# Patient Record
Sex: Female | Born: 1950
Health system: Southern US, Community
[De-identification: ages and names within clinical notes are randomized; demographics above are authoritative.]

## PROBLEM LIST (undated history)

## (undated) DIAGNOSIS — E079 Disorder of thyroid, unspecified: Secondary | ICD-10-CM

## (undated) DIAGNOSIS — R112 Nausea with vomiting, unspecified: Secondary | ICD-10-CM

## (undated) DIAGNOSIS — Z9889 Other specified postprocedural states: Secondary | ICD-10-CM

## (undated) DIAGNOSIS — E119 Type 2 diabetes mellitus without complications: Secondary | ICD-10-CM

## (undated) DIAGNOSIS — M1990 Unspecified osteoarthritis, unspecified site: Secondary | ICD-10-CM

## (undated) DIAGNOSIS — G629 Polyneuropathy, unspecified: Secondary | ICD-10-CM

## (undated) DIAGNOSIS — K5790 Diverticulosis of intestine, part unspecified, without perforation or abscess without bleeding: Secondary | ICD-10-CM

## (undated) HISTORY — DX: Unspecified osteoarthritis, unspecified site: M19.90

## (undated) HISTORY — DX: Polyneuropathy, unspecified: G62.9

## (undated) HISTORY — PX: ABDOMINAL HYSTERECTOMY: SHX81

## (undated) HISTORY — PX: HEMORRHOID SURGERY: SHX153

## (undated) HISTORY — PX: CHOLECYSTECTOMY: SHX55

---

## 2016-09-22 ENCOUNTER — Other Ambulatory Visit: Payer: Self-pay | Admitting: Endocrinology

## 2016-09-22 DIAGNOSIS — E049 Nontoxic goiter, unspecified: Secondary | ICD-10-CM

## 2016-09-26 ENCOUNTER — Encounter: Payer: BLUE CROSS/BLUE SHIELD | Attending: Endocrinology | Admitting: Registered"

## 2016-09-26 DIAGNOSIS — Z713 Dietary counseling and surveillance: Secondary | ICD-10-CM | POA: Insufficient documentation

## 2016-09-26 DIAGNOSIS — E119 Type 2 diabetes mellitus without complications: Secondary | ICD-10-CM | POA: Diagnosis not present

## 2016-09-26 NOTE — Patient Instructions (Addendum)
Plan:  Aim for 2-3 Carb Choices per meal (30-45 grams)  Aim for 0-1 Carbs per snack if hungry  Include protein in moderation with your meals and snacks Consider having oatmeal with walnuts for breakfast option Aim to have protein with breakfast Consider reading food labels for Total Carbohydrate  Consider  increasing your activity level 30 minutes daily as tolerated Continue checking BG at alternate times per day as directed by MD  Constinue taking medication as directed by MD BRAT diet to control diarrhea - bananas, rice, apples, toast (be sure to count carbs)

## 2016-09-26 NOTE — Progress Notes (Signed)
Diabetes Self-Management Education  Visit Type: First/Initial  Appt. Start Time: 1400 Appt. End Time: 1500  09/26/2016  Pamela Murray, identified by name and date of birth, is a 66 y.o. female with a diagnosis of Diabetes: Type 2.   ASSESSMENT Patient states she has a hard time with a lot of foods due to diverticulitis which causes diarrhea and reflux with Aciphex is the only reflux med she can take. Avoids spicy foods. She reports she thinks she has been eating too many potatoes, bread and pasta but is motivated to do better. Patient reports that she is expecting her first grandbaby in October and wants to feel well enough to spend time taking care of the new baby. She is very motivated to take care of her diabetes and will work on diet and exercise and return in 3 months for a follow-up after next A1C test.  Height 5' 5.5" (1.664 m), weight 194 lb 9.6 oz (88.3 kg). Body mass index is 31.89 kg/m.      Diabetes Self-Management Education - 09/26/16 1413      Visit Information   Visit Type First/Initial     Initial Visit   Diabetes Type Type 2   Are you currently following a meal plan? Yes   What type of meal plan do you follow? limit carbohydrates   Are you taking your medications as prescribed? Yes   Date Diagnosed 2002     Health Coping   How would you rate your overall health? Fair     Psychosocial Assessment   Patient Belief/Attitude about Diabetes Motivated to manage diabetes   How often do you need to have someone help you when you read instructions, pamphlets, or other written materials from your doctor or pharmacy? 1 - Never   What is the last grade level you completed in school? 12     Complications   Last HgB A1C per patient/outside source 9.9 %   How often do you check your blood sugar? 1-2 times/day   Fasting Blood glucose range (mg/dL) 130-179  127-209, believes the high was from late dinner   Number of hypoglycemic episodes per month 0   Number of  hyperglycemic episodes per week 2   Can you tell when your blood sugar is high? Yes  feels dizzy and spacy   What do you do if your blood sugar is high? drinks water and moves   Have you had a dilated eye exam in the past 12 months? Yes   Have you had a dental exam in the past 12 months? Yes   Are you checking your feet? Yes   How many days per week are you checking your feet? 1     Dietary Intake   Breakfast special k with red berries, lactose-free milk, coffee OR grits OR toast with margine and jelly   Snack (morning) 2-3 ginger snaps    Lunch grits OR grilled chicken sandwich, chips, water, fruit punch   Snack (afternoon) sugar free chocolate pudding   Dinner seafood OR salad with grilled chiken OR spaghetti   Beverage(s) diet dr pepper     Exercise   Exercise Type ADL's   How many days per week to you exercise? 0   How many minutes per day do you exercise? 0   Total minutes per week of exercise 0     Patient Education   Previous Diabetes Education Yes (please comment)  2002   Nutrition management  Role of diet in the  treatment of diabetes and the relationship between the three main macronutrients and blood glucose level;Food label reading, portion sizes and measuring food.;Carbohydrate counting   Physical activity and exercise  Role of exercise on diabetes management, blood pressure control and cardiac health.   Monitoring Identified appropriate SMBG and/or A1C goals.     Individualized Goals (developed by patient)   Nutrition General guidelines for healthy choices and portions discussed   Physical Activity Exercise 5-7 days per week     Outcomes   Expected Outcomes Demonstrated interest in learning. Expect positive outcomes   Future DMSE PRN   Program Status Completed      Individualized Plan for Diabetes Self-Management Training:   Learning Objective:  Patient will have a greater understanding of diabetes self-management. Patient education plan is to attend  individual and/or group sessions per assessed needs and concerns.  Patient Instructions  Plan:  Aim for 2-3 Carb Choices per meal (30-45 grams)  Aim for 0-1 Carbs per snack if hungry  Include protein in moderation with your meals and snacks Consider having oatmeal with walnuts for breakfast option Aim to have protein with breakfast Consider reading food labels for Total Carbohydrate  Consider  increasing your activity level 30 minutes daily as tolerated Continue checking BG at alternate times per day as directed by MD  Constinue taking medication as directed by MD BRAT diet to control diarrhea - bananas, rice, apples, toast (be sure to count carbs)    Expected Outcomes:  Demonstrated interest in learning. Expect positive outcomes  Education material provided:   Carb Counting and Food Label handouts  Meal Plan Card  A1c handout  Portion Plate  Snack Suggestions  Types of Fat  If problems or questions, patient to contact team via:  Phone and Email  Future DSME appointment: PRN

## 2016-12-14 ENCOUNTER — Ambulatory Visit
Admission: RE | Admit: 2016-12-14 | Discharge: 2016-12-14 | Disposition: A | Discharge status: Home / Self Care | Payer: BLUE CROSS/BLUE SHIELD | Source: Ambulatory Visit | Attending: Endocrinology | Admitting: Endocrinology

## 2016-12-14 DIAGNOSIS — E049 Nontoxic goiter, unspecified: Secondary | ICD-10-CM

## 2016-12-26 ENCOUNTER — Other Ambulatory Visit: Payer: Self-pay | Admitting: Endocrinology

## 2016-12-26 ENCOUNTER — Ambulatory Visit: Payer: BLUE CROSS/BLUE SHIELD | Admitting: Registered"

## 2016-12-26 DIAGNOSIS — E049 Nontoxic goiter, unspecified: Secondary | ICD-10-CM

## 2016-12-28 ENCOUNTER — Ambulatory Visit
Admission: RE | Admit: 2016-12-28 | Discharge: 2016-12-28 | Disposition: A | Discharge status: Home / Self Care | Payer: BLUE CROSS/BLUE SHIELD | Source: Ambulatory Visit | Attending: Endocrinology | Admitting: Endocrinology

## 2016-12-28 ENCOUNTER — Other Ambulatory Visit (HOSPITAL_COMMUNITY)
Admission: RE | Admit: 2016-12-28 | Discharge: 2016-12-28 | Disposition: A | Discharge status: Home / Self Care | Payer: BLUE CROSS/BLUE SHIELD | Source: Ambulatory Visit | Attending: Radiology | Admitting: Radiology

## 2016-12-28 DIAGNOSIS — E041 Nontoxic single thyroid nodule: Secondary | ICD-10-CM | POA: Diagnosis not present

## 2016-12-28 DIAGNOSIS — E049 Nontoxic goiter, unspecified: Secondary | ICD-10-CM

## 2017-01-12 DIAGNOSIS — E538 Deficiency of other specified B group vitamins: Secondary | ICD-10-CM | POA: Diagnosis not present

## 2017-01-16 ENCOUNTER — Encounter (HOSPITAL_COMMUNITY): Payer: Self-pay

## 2017-02-23 DIAGNOSIS — I10 Essential (primary) hypertension: Secondary | ICD-10-CM | POA: Diagnosis not present

## 2017-02-23 DIAGNOSIS — F411 Generalized anxiety disorder: Secondary | ICD-10-CM | POA: Diagnosis not present

## 2017-02-23 DIAGNOSIS — Z79899 Other long term (current) drug therapy: Secondary | ICD-10-CM | POA: Diagnosis not present

## 2017-02-23 DIAGNOSIS — G894 Chronic pain syndrome: Secondary | ICD-10-CM | POA: Diagnosis not present

## 2017-02-23 DIAGNOSIS — M5030 Other cervical disc degeneration, unspecified cervical region: Secondary | ICD-10-CM | POA: Diagnosis not present

## 2017-02-23 DIAGNOSIS — Z23 Encounter for immunization: Secondary | ICD-10-CM | POA: Diagnosis not present

## 2017-02-23 DIAGNOSIS — E119 Type 2 diabetes mellitus without complications: Secondary | ICD-10-CM | POA: Diagnosis not present

## 2017-02-23 DIAGNOSIS — Z0000 Encounter for general adult medical examination without abnormal findings: Secondary | ICD-10-CM | POA: Diagnosis not present

## 2017-02-27 DIAGNOSIS — E538 Deficiency of other specified B group vitamins: Secondary | ICD-10-CM | POA: Diagnosis not present

## 2017-03-30 DIAGNOSIS — E538 Deficiency of other specified B group vitamins: Secondary | ICD-10-CM | POA: Diagnosis not present

## 2017-03-30 DIAGNOSIS — Z23 Encounter for immunization: Secondary | ICD-10-CM | POA: Diagnosis not present

## 2017-04-30 DIAGNOSIS — E538 Deficiency of other specified B group vitamins: Secondary | ICD-10-CM | POA: Diagnosis not present

## 2017-05-04 DIAGNOSIS — E049 Nontoxic goiter, unspecified: Secondary | ICD-10-CM | POA: Diagnosis not present

## 2017-05-04 DIAGNOSIS — E1165 Type 2 diabetes mellitus with hyperglycemia: Secondary | ICD-10-CM | POA: Diagnosis not present

## 2017-05-04 DIAGNOSIS — E039 Hypothyroidism, unspecified: Secondary | ICD-10-CM | POA: Diagnosis not present

## 2017-05-04 DIAGNOSIS — E7800 Pure hypercholesterolemia, unspecified: Secondary | ICD-10-CM | POA: Diagnosis not present

## 2017-05-04 DIAGNOSIS — I10 Essential (primary) hypertension: Secondary | ICD-10-CM | POA: Diagnosis not present

## 2017-05-04 DIAGNOSIS — E538 Deficiency of other specified B group vitamins: Secondary | ICD-10-CM | POA: Diagnosis not present

## 2017-05-21 DIAGNOSIS — J069 Acute upper respiratory infection, unspecified: Secondary | ICD-10-CM | POA: Diagnosis not present

## 2017-05-21 DIAGNOSIS — J209 Acute bronchitis, unspecified: Secondary | ICD-10-CM | POA: Diagnosis not present

## 2017-05-30 ENCOUNTER — Emergency Department (HOSPITAL_COMMUNITY)
Admission: EM | Admit: 2017-05-30 | Discharge: 2017-05-30 | Disposition: A | Discharge status: Home / Self Care | Payer: Medicare HMO | Attending: Emergency Medicine | Admitting: Emergency Medicine

## 2017-05-30 ENCOUNTER — Other Ambulatory Visit: Payer: Self-pay

## 2017-05-30 ENCOUNTER — Emergency Department (HOSPITAL_COMMUNITY): Payer: Medicare HMO

## 2017-05-30 ENCOUNTER — Encounter (HOSPITAL_COMMUNITY): Payer: Self-pay

## 2017-05-30 DIAGNOSIS — E119 Type 2 diabetes mellitus without complications: Secondary | ICD-10-CM | POA: Diagnosis not present

## 2017-05-30 DIAGNOSIS — R000 Tachycardia, unspecified: Secondary | ICD-10-CM | POA: Diagnosis not present

## 2017-05-30 DIAGNOSIS — R110 Nausea: Secondary | ICD-10-CM | POA: Insufficient documentation

## 2017-05-30 DIAGNOSIS — R1084 Generalized abdominal pain: Secondary | ICD-10-CM

## 2017-05-30 DIAGNOSIS — R109 Unspecified abdominal pain: Secondary | ICD-10-CM | POA: Diagnosis not present

## 2017-05-30 DIAGNOSIS — R079 Chest pain, unspecified: Secondary | ICD-10-CM | POA: Diagnosis not present

## 2017-05-30 DIAGNOSIS — R1030 Lower abdominal pain, unspecified: Secondary | ICD-10-CM | POA: Diagnosis not present

## 2017-05-30 DIAGNOSIS — R1031 Right lower quadrant pain: Secondary | ICD-10-CM | POA: Diagnosis present

## 2017-05-30 HISTORY — DX: Disorder of thyroid, unspecified: E07.9

## 2017-05-30 HISTORY — DX: Type 2 diabetes mellitus without complications: E11.9

## 2017-05-30 LAB — CBC
HEMATOCRIT: 42.6 % (ref 36.0–46.0)
Hemoglobin: 14.5 g/dL (ref 12.0–15.0)
MCH: 30.3 pg (ref 26.0–34.0)
MCHC: 34 g/dL (ref 30.0–36.0)
MCV: 89.1 fL (ref 78.0–100.0)
PLATELETS: 187 10*3/uL (ref 150–400)
RBC: 4.78 MIL/uL (ref 3.87–5.11)
RDW: 13.3 % (ref 11.5–15.5)
WBC: 6 10*3/uL (ref 4.0–10.5)

## 2017-05-30 LAB — URINALYSIS, ROUTINE W REFLEX MICROSCOPIC
BILIRUBIN URINE: NEGATIVE
Ketones, ur: 5 mg/dL — AB
LEUKOCYTES UA: NEGATIVE
NITRITE: NEGATIVE
Protein, ur: 100 mg/dL — AB
SPECIFIC GRAVITY, URINE: 1.032 — AB (ref 1.005–1.030)
pH: 5 (ref 5.0–8.0)

## 2017-05-30 LAB — LIPASE, BLOOD: LIPASE: 22 U/L (ref 11–51)

## 2017-05-30 LAB — COMPREHENSIVE METABOLIC PANEL
ALT: 45 U/L (ref 14–54)
AST: 39 U/L (ref 15–41)
Albumin: 4.1 g/dL (ref 3.5–5.0)
Alkaline Phosphatase: 62 U/L (ref 38–126)
Anion gap: 10 (ref 5–15)
BUN: 16 mg/dL (ref 6–20)
CHLORIDE: 95 mmol/L — AB (ref 101–111)
CO2: 27 mmol/L (ref 22–32)
CREATININE: 1.2 mg/dL — AB (ref 0.44–1.00)
Calcium: 9.8 mg/dL (ref 8.9–10.3)
GFR, EST AFRICAN AMERICAN: 54 mL/min — AB (ref >60)
GFR, EST NON AFRICAN AMERICAN: 46 mL/min — AB (ref >60)
Glucose, Bld: 300 mg/dL — ABNORMAL HIGH (ref 65–99)
POTASSIUM: 4.3 mmol/L (ref 3.5–5.1)
Sodium: 132 mmol/L — ABNORMAL LOW (ref 135–145)
TOTAL PROTEIN: 6.8 g/dL (ref 6.5–8.1)
Total Bilirubin: 0.5 mg/dL (ref 0.3–1.2)

## 2017-05-30 LAB — I-STAT TROPONIN, ED: Troponin i, poc: 0.01 ng/mL (ref 0.00–0.08)

## 2017-05-30 MED ORDER — ONDANSETRON HCL 4 MG/2ML IJ SOLN
4.0000 mg | Freq: Once | INTRAMUSCULAR | Status: AC
  Administered 2017-05-30: 4 mg via INTRAVENOUS
  Filled 2017-05-30: qty 2

## 2017-05-30 MED ORDER — ONDANSETRON 4 MG PO TBDP: 4.0000 mg | ORAL_TABLET | Freq: Three times a day (TID) | ORAL | 0 refills | Status: DC | PRN

## 2017-05-30 MED ORDER — IOPAMIDOL (ISOVUE-300) INJECTION 61%
INTRAVENOUS | Status: AC
  Administered 2017-05-30: 100 mL
  Filled 2017-05-30: qty 100

## 2017-05-30 MED ORDER — SODIUM CHLORIDE 0.9 % IV BOLUS (SEPSIS)
1000.0000 mL | Freq: Once | INTRAVENOUS | Status: AC
  Administered 2017-05-30: 1000 mL via INTRAVENOUS

## 2017-05-30 MED ORDER — GI COCKTAIL ~~LOC~~
30.0000 mL | Freq: Once | ORAL | Status: AC
  Administered 2017-05-30: 30 mL via ORAL
  Filled 2017-05-30: qty 30

## 2017-05-30 MED ORDER — SODIUM CHLORIDE 0.9 % IV BOLUS (SEPSIS)
500.0000 mL | Freq: Once | INTRAVENOUS | Status: AC
  Administered 2017-05-30: 500 mL via INTRAVENOUS

## 2017-05-30 MED ORDER — DICYCLOMINE HCL 10 MG PO CAPS
10.0000 mg | ORAL_CAPSULE | Freq: Once | ORAL | Status: AC
  Administered 2017-05-30: 10 mg via ORAL
  Filled 2017-05-30: qty 1

## 2017-05-30 MED ORDER — SUCRALFATE 1 GM/10ML PO SUSP: 1.0000 g | Freq: Three times a day (TID) | ORAL | 0 refills | Status: DC

## 2017-05-30 MED ORDER — MORPHINE SULFATE (PF) 4 MG/ML IV SOLN
4.0000 mg | Freq: Once | INTRAVENOUS | Status: AC
  Administered 2017-05-30: 4 mg via INTRAVENOUS
  Filled 2017-05-30: qty 1

## 2017-05-30 NOTE — ED Notes (Signed)
Patient transported to X-ray 

## 2017-05-30 NOTE — ED Provider Notes (Signed)
Emergency Department Provider Note   I have reviewed the triage vital signs and the nursing notes.   HISTORY  Chief Complaint Abdominal Pain   HPI Pamela Murray is a 66 y.o. female with PMH of DM, thyroid disease, and chronic abdominal pain is to the emergency department for evaluation of worsening abdominal pain, distention, and elevated heart rate.  The patient states that she has had abdominal pain for the last 30 years but it has been worse over the last year.  States she frequently feels lightheaded and continues to have this today.  Went to her primary care physician's office today was found to have an elevated heart rate with worsening abdominal pain.  She was then sent to the emergency department for further evaluation including CT imaging of the abdomen.  Denies any dysuria, she denies vaginal bleeding or discharge.  No worsening vomiting or diarrhea.  No blood in the stool.  No recent antibiotics.  She denies any chest pain, palpitations, difficulty breathing. No radiation in symptoms. She has tried Hydrocodone at home with little to no relief in symptoms.   Past Medical History:  Diagnosis Date  . Diabetes mellitus without complication ([place])   . Thyroid disease     There are no active problems to display for this patient.   Current Outpatient Rx  . Order #: [id number] Class: Historical Med  . Order #: [id number] Class: Historical Med  . Order #: [id number] Class: Historical Med  . Order #: [id number] Class: Historical Med  . Order #: [id number] Class: Historical Med  . Order #: [id number] Class: Historical Med  . Order #: [id number] Class: Historical Med  . Order #: [id number] Class: Historical Med  . Order #: [id number] Class: Historical Med  . Order #: [id number] Class: Historical Med  . Order #: [id number] Class: Historical Med  . Order #: [id number] Class: Historical Med  . Order #: [id number] Class: Historical Med  . Order #: [id number] Class: Historical Med  . Order #:  [id number] Class: Historical Med  . Order #: [id number] Class: Historical Med  . Order #: [id number] Class: Print  . Order #: [id number] Class: Print    Allergies Statins  No family history on file.  Social History Social History   Tobacco Use  . Smoking status: Never Smoker  . Smokeless tobacco: Never Used  Substance Use Topics  . Alcohol use: No    Frequency: Never  . Drug use: Not on file    Review of Systems  Constitutional: No fever/chills. Positive fatigue.  Eyes: No visual changes. ENT: No sore throat. Cardiovascular: Denies chest pain. Respiratory: Denies shortness of breath. Gastrointestinal: Positive abdominal pain. Positive nausea, no vomiting. Positive diarrhea.  No constipation. Genitourinary: Negative for dysuria. Musculoskeletal: Negative for back pain. Skin: Negative for rash. Neurological: Negative for headaches, focal weakness or numbness.  10-point ROS otherwise negative.  ____________________________________________   PHYSICAL EXAM:  VITAL SIGNS: ED Triage Vitals  Enc Vitals Group     BP 05/30/17 1724 132/86     Pulse Rate 05/30/17 1724 (!) 145     Resp 05/30/17 1724 18     Temp 05/30/17 1724 100.2 F (37.9 C)     Temp Source 05/30/17 1724 Oral     SpO2 05/30/17 1724 97 %     Pain Score 05/30/17 1723 9   Constitutional: Alert and oriented. Well appearing and in no acute distress. Eyes: Conjunctivae are normal.  Head: Atraumatic. Nose: No congestion/rhinnorhea. Mouth/Throat: Mucous membranes are moist.  Neck: No stridor.  Cardiovascular: Sinus tachycardia. Good peripheral circulation. Grossly normal heart sounds.  Respiratory: Normal respiratory effort.  No retractions. Lungs CTAB. Gastrointestinal: Soft with mild distention and focal RLQ tenderness to palpation.  Musculoskeletal: No lower extremity tenderness nor edema. No gross deformities of extremities. Neurologic:  Normal speech and language. No gross focal neurologic deficits are  appreciated.  Skin:  Skin is warm, dry and intact. No rash noted.   ____________________________________________   LABS (all labs ordered are listed, but only abnormal results are displayed)  Labs Reviewed  COMPREHENSIVE METABOLIC PANEL - Abnormal; Notable for the following components:      Result Value   Sodium 132 (*)    Chloride 95 (*)    Glucose, Bld 300 (*)    Creatinine, Ser 1.20 (*)    GFR calc non Af Amer 46 (*)    GFR calc Af Amer 54 (*)    All other components within normal limits  URINALYSIS, ROUTINE W REFLEX MICROSCOPIC - Abnormal; Notable for the following components:   Color, Urine AMBER (*)    APPearance HAZY (*)    Specific Gravity, Urine 1.032 (*)    Glucose, UA >=500 (*)    Hgb urine dipstick SMALL (*)    Ketones, ur 5 (*)    Protein, ur 100 (*)    Bacteria, UA RARE (*)    Squamous Epithelial / LPF 0-5 (*)    All other components within normal limits  LIPASE, BLOOD  CBC  I-STAT TROPONIN, ED   ____________________________________________  EKG   EKG Interpretation  Date/Time:  Wednesday May 30 2017 17:24:46 EST Ventricular Rate:  135 PR Interval:  122 QRS Duration: 78 QT Interval:  282 QTC Calculation: 423 R Axis:   101 Text Interpretation:  Sinus tachycardia Right axis Abnormal ECG No STEMI.  Confirmed by Nanda Quinton [phone]) on 05/30/2017 5:46:45 PM       ____________________________________________  RADIOLOGY  Dg Chest 2 View  Result Date: 05/30/2017 CLINICAL DATA:  Tachycardia.  Chest pain. EXAM: CHEST  2 VIEW COMPARISON:  None. FINDINGS: Low volume chest. There is no edema, consolidation, effusion, or pneumothorax. Normal heart size and mediastinal contours. Cholecystectomy clips. IMPRESSION: Low volume chest without acute superimposed finding. Electronically Signed   By: Monte Fantasia M.D.   On: 05/30/2017 18:46   Ct Abdomen Pelvis W Contrast  Result Date: 05/30/2017 CLINICAL DATA:  Abdominal pain with diverticulitis  suspected. EXAM: CT ABDOMEN AND PELVIS WITH CONTRAST TECHNIQUE: Multidetector CT imaging of the abdomen and pelvis was performed using the standard protocol following bolus administration of intravenous contrast. CONTRAST:  <See Chart> ISOVUE-300 IOPAMIDOL (ISOVUE-300) INJECTION 61% COMPARISON:  None. FINDINGS: Lower chest:  No acute finding. Hepatobiliary: Borderline for hepatic steatosis. Negative for liver lesion.Cholecystectomy. Normal common bile duct diameter. Pancreas: Generalized atrophy.  No inflammation or duct dilatation. Spleen: Unremarkable. Adrenals/Urinary Tract: Negative adrenals. No hydronephrosis or stone. Bilateral renal cortical low densities are too small for accurate densitometry. Unremarkable bladder. Stomach/Bowel: No obstruction. No appendicitis. Scattered colonic diverticula. No diverticulitis. Vascular/Lymphatic: Mild atherosclerotic calcification. No mass or adenopathy. Reproductive:Hysterectomy.  Negative adnexae. Other: No ascites or pneumoperitoneum. Musculoskeletal: Lumbar disc and facet degeneration. No acute osseous finding. IMPRESSION: 1. No acute finding or explanation for abdominal pain. 2. Cholecystectomy and hysterectomy. Electronically Signed   By: Monte Fantasia M.D.   On: 05/30/2017 20:20    ____________________________________________   PROCEDURES  Procedure(s) performed:   Procedures  None ____________________________________________   INITIAL IMPRESSION / ASSESSMENT AND PLAN / ED COURSE  Pertinent labs & imaging results that were available during my care  of the patient were reviewed by me and considered in my medical decision making (see chart for details).  Patient presents to the emergency department for evaluation of cardiac, borderline fever.  Patient with history of chronic abdominal pain but states it is worse recently.  She has focal right lower quadrant tenderness to palpation but no rebound or guarding.  EKG shows sinus rhythm.  Labs thus  far are unremarkable but plan for CT of the abdomen and pelvis to rule out acute appendicitis/diverticulitis/colitis. No clinical concern for SBO but partial obstruction is a possibility.   08:33 PM Patient with normal CT abdomen/pelvis. HR down-trending. Will give additional IVF, Bentyl, and Gi cocktail.   09:10 PM Patient tolerating PO. HR down-trending and back to normal. Plan for discharge with plan to increase PO fluids and add Carafate and Zofran.   At this time, I do not feel there is any life-threatening condition present. I have reviewed and discussed all results (EKG, imaging, lab, urine as appropriate), exam findings with patient. I have reviewed nursing notes and appropriate previous records.  I feel the patient is safe to be discharged home without further emergent workup. Discussed usual and customary return precautions. Patient and family (if present) verbalize understanding and are comfortable with this plan.  Patient will follow-up with their primary care provider. If they do not have a primary care provider, information for follow-up has been provided to them. All questions have been answered.  ____________________________________________  FINAL CLINICAL IMPRESSION(S) / ED DIAGNOSES  Final diagnoses:  Generalized abdominal pain  Tachycardia  Nausea     MEDICATIONS GIVEN DURING THIS VISIT:  Medications  sodium chloride 0.9 % bolus 1,000 mL (0 mLs Intravenous Stopped 05/30/17 2009)  morphine 4 MG/ML injection 4 mg (4 mg Intravenous Given 05/30/17 1908)  ondansetron (ZOFRAN) injection 4 mg (4 mg Intravenous Given 05/30/17 1909)  iopamidol (ISOVUE-300) 61 % injection (100 mLs  Contrast Given 05/30/17 1937)  dicyclomine (BENTYL) capsule 10 mg (10 mg Oral Given 05/30/17 2036)  gi cocktail (Maalox,Lidocaine,Donnatal) (30 mLs Oral Given 05/30/17 2036)  sodium chloride 0.9 % bolus 500 mL (500 mLs Intravenous New Bag/Given 05/30/17 2038)     NEW OUTPATIENT MEDICATIONS  STARTED DURING THIS VISIT:  Carafate and Zofran   Note:  This document was prepared using Dragon voice recognition software and may include unintentional dictation errors.  Nanda Quinton, MD Emergency Medicine    Dorianne Perret, [facility], MD 05/30/17 2114

## 2017-05-30 NOTE — Discharge Instructions (Signed)

## 2017-05-30 NOTE — ED Triage Notes (Addendum)
Pt states she has had abdominal pain X2 weeks. She was sent here by her PCP for tachycardia and abdominal pain and PCP is concerned for sepsis. Pt also reports some chest discomfort/fluttering and dizziness.

## 2017-06-13 DIAGNOSIS — I10 Essential (primary) hypertension: Secondary | ICD-10-CM | POA: Diagnosis not present

## 2017-06-13 DIAGNOSIS — Z86010 Personal history of colonic polyps: Secondary | ICD-10-CM | POA: Diagnosis not present

## 2017-06-13 DIAGNOSIS — K2270 Barrett's esophagus without dysplasia: Secondary | ICD-10-CM | POA: Diagnosis not present

## 2017-06-13 DIAGNOSIS — K219 Gastro-esophageal reflux disease without esophagitis: Secondary | ICD-10-CM | POA: Diagnosis not present

## 2017-06-13 DIAGNOSIS — K589 Irritable bowel syndrome without diarrhea: Secondary | ICD-10-CM | POA: Diagnosis not present

## 2017-06-28 DIAGNOSIS — D485 Neoplasm of uncertain behavior of skin: Secondary | ICD-10-CM | POA: Diagnosis not present

## 2017-06-28 DIAGNOSIS — L710 Perioral dermatitis: Secondary | ICD-10-CM | POA: Diagnosis not present

## 2017-06-28 DIAGNOSIS — D225 Melanocytic nevi of trunk: Secondary | ICD-10-CM | POA: Diagnosis not present

## 2017-06-29 DIAGNOSIS — E538 Deficiency of other specified B group vitamins: Secondary | ICD-10-CM | POA: Diagnosis not present

## 2017-07-19 DIAGNOSIS — E119 Type 2 diabetes mellitus without complications: Secondary | ICD-10-CM | POA: Diagnosis not present

## 2017-07-19 DIAGNOSIS — Z794 Long term (current) use of insulin: Secondary | ICD-10-CM | POA: Diagnosis not present

## 2017-07-19 DIAGNOSIS — H527 Unspecified disorder of refraction: Secondary | ICD-10-CM | POA: Diagnosis not present

## 2017-07-25 DIAGNOSIS — D124 Benign neoplasm of descending colon: Secondary | ICD-10-CM | POA: Diagnosis not present

## 2017-07-25 DIAGNOSIS — K5730 Diverticulosis of large intestine without perforation or abscess without bleeding: Secondary | ICD-10-CM | POA: Diagnosis not present

## 2017-07-25 DIAGNOSIS — Z86010 Personal history of colonic polyps: Secondary | ICD-10-CM | POA: Diagnosis not present

## 2017-07-27 DIAGNOSIS — E538 Deficiency of other specified B group vitamins: Secondary | ICD-10-CM | POA: Diagnosis not present

## 2017-08-07 DIAGNOSIS — L988 Other specified disorders of the skin and subcutaneous tissue: Secondary | ICD-10-CM | POA: Diagnosis not present

## 2017-08-07 DIAGNOSIS — D485 Neoplasm of uncertain behavior of skin: Secondary | ICD-10-CM | POA: Diagnosis not present

## 2017-08-13 DIAGNOSIS — Z1231 Encounter for screening mammogram for malignant neoplasm of breast: Secondary | ICD-10-CM | POA: Diagnosis not present

## 2017-08-13 DIAGNOSIS — Z01419 Encounter for gynecological examination (general) (routine) without abnormal findings: Secondary | ICD-10-CM | POA: Diagnosis not present

## 2017-08-20 DIAGNOSIS — E7800 Pure hypercholesterolemia, unspecified: Secondary | ICD-10-CM | POA: Diagnosis not present

## 2017-08-20 DIAGNOSIS — E538 Deficiency of other specified B group vitamins: Secondary | ICD-10-CM | POA: Diagnosis not present

## 2017-08-20 DIAGNOSIS — E1165 Type 2 diabetes mellitus with hyperglycemia: Secondary | ICD-10-CM | POA: Diagnosis not present

## 2017-08-20 DIAGNOSIS — I10 Essential (primary) hypertension: Secondary | ICD-10-CM | POA: Diagnosis not present

## 2017-08-20 DIAGNOSIS — E039 Hypothyroidism, unspecified: Secondary | ICD-10-CM | POA: Diagnosis not present

## 2017-08-20 DIAGNOSIS — E049 Nontoxic goiter, unspecified: Secondary | ICD-10-CM | POA: Diagnosis not present

## 2017-08-20 DIAGNOSIS — R21 Rash and other nonspecific skin eruption: Secondary | ICD-10-CM | POA: Diagnosis not present

## 2017-08-28 DIAGNOSIS — E538 Deficiency of other specified B group vitamins: Secondary | ICD-10-CM | POA: Diagnosis not present

## 2017-08-28 DIAGNOSIS — Z79899 Other long term (current) drug therapy: Secondary | ICD-10-CM | POA: Diagnosis not present

## 2017-08-28 DIAGNOSIS — R21 Rash and other nonspecific skin eruption: Secondary | ICD-10-CM | POA: Diagnosis not present

## 2017-08-28 DIAGNOSIS — E119 Type 2 diabetes mellitus without complications: Secondary | ICD-10-CM | POA: Diagnosis not present

## 2017-08-28 DIAGNOSIS — M5136 Other intervertebral disc degeneration, lumbar region: Secondary | ICD-10-CM | POA: Diagnosis not present

## 2017-08-28 DIAGNOSIS — G894 Chronic pain syndrome: Secondary | ICD-10-CM | POA: Diagnosis not present

## 2017-08-28 DIAGNOSIS — I10 Essential (primary) hypertension: Secondary | ICD-10-CM | POA: Diagnosis not present

## 2017-08-28 DIAGNOSIS — M5030 Other cervical disc degeneration, unspecified cervical region: Secondary | ICD-10-CM | POA: Diagnosis not present

## 2017-08-30 DIAGNOSIS — B86 Scabies: Secondary | ICD-10-CM | POA: Diagnosis not present

## 2017-09-17 DIAGNOSIS — E1165 Type 2 diabetes mellitus with hyperglycemia: Secondary | ICD-10-CM | POA: Diagnosis not present

## 2017-09-25 DIAGNOSIS — E538 Deficiency of other specified B group vitamins: Secondary | ICD-10-CM | POA: Diagnosis not present

## 2017-10-12 DIAGNOSIS — L738 Other specified follicular disorders: Secondary | ICD-10-CM | POA: Diagnosis not present

## 2017-10-12 DIAGNOSIS — K130 Diseases of lips: Secondary | ICD-10-CM | POA: Diagnosis not present

## 2017-10-24 DIAGNOSIS — E538 Deficiency of other specified B group vitamins: Secondary | ICD-10-CM | POA: Diagnosis not present

## 2017-11-23 DIAGNOSIS — E538 Deficiency of other specified B group vitamins: Secondary | ICD-10-CM | POA: Diagnosis not present

## 2017-12-24 DIAGNOSIS — E538 Deficiency of other specified B group vitamins: Secondary | ICD-10-CM | POA: Diagnosis not present

## 2018-01-15 DIAGNOSIS — G4733 Obstructive sleep apnea (adult) (pediatric): Secondary | ICD-10-CM | POA: Diagnosis not present

## 2018-01-15 DIAGNOSIS — Z6834 Body mass index (BMI) 34.0-34.9, adult: Secondary | ICD-10-CM | POA: Diagnosis not present

## 2018-01-15 DIAGNOSIS — Z9989 Dependence on other enabling machines and devices: Secondary | ICD-10-CM | POA: Diagnosis not present

## 2018-01-21 ENCOUNTER — Other Ambulatory Visit: Payer: Self-pay | Admitting: Endocrinology

## 2018-01-21 DIAGNOSIS — E538 Deficiency of other specified B group vitamins: Secondary | ICD-10-CM | POA: Diagnosis not present

## 2018-01-21 DIAGNOSIS — E049 Nontoxic goiter, unspecified: Secondary | ICD-10-CM

## 2018-01-21 DIAGNOSIS — E1165 Type 2 diabetes mellitus with hyperglycemia: Secondary | ICD-10-CM | POA: Diagnosis not present

## 2018-01-21 DIAGNOSIS — I10 Essential (primary) hypertension: Secondary | ICD-10-CM | POA: Diagnosis not present

## 2018-01-21 DIAGNOSIS — E039 Hypothyroidism, unspecified: Secondary | ICD-10-CM | POA: Diagnosis not present

## 2018-01-21 DIAGNOSIS — E7800 Pure hypercholesterolemia, unspecified: Secondary | ICD-10-CM | POA: Diagnosis not present

## 2018-01-23 DIAGNOSIS — E538 Deficiency of other specified B group vitamins: Secondary | ICD-10-CM | POA: Diagnosis not present

## 2018-01-29 ENCOUNTER — Ambulatory Visit
Admission: RE | Admit: 2018-01-29 | Discharge: 2018-01-29 | Disposition: A | Discharge status: Home / Self Care | Payer: Medicare HMO | Source: Ambulatory Visit | Attending: Endocrinology | Admitting: Endocrinology

## 2018-01-29 DIAGNOSIS — E041 Nontoxic single thyroid nodule: Secondary | ICD-10-CM | POA: Diagnosis not present

## 2018-01-29 DIAGNOSIS — E049 Nontoxic goiter, unspecified: Secondary | ICD-10-CM

## 2018-03-01 DIAGNOSIS — Z23 Encounter for immunization: Secondary | ICD-10-CM | POA: Diagnosis not present

## 2018-03-01 DIAGNOSIS — M5136 Other intervertebral disc degeneration, lumbar region: Secondary | ICD-10-CM | POA: Diagnosis not present

## 2018-03-01 DIAGNOSIS — Z0001 Encounter for general adult medical examination with abnormal findings: Secondary | ICD-10-CM | POA: Diagnosis not present

## 2018-03-01 DIAGNOSIS — R002 Palpitations: Secondary | ICD-10-CM | POA: Diagnosis not present

## 2018-03-01 DIAGNOSIS — Z79899 Other long term (current) drug therapy: Secondary | ICD-10-CM | POA: Diagnosis not present

## 2018-03-01 DIAGNOSIS — I10 Essential (primary) hypertension: Secondary | ICD-10-CM | POA: Diagnosis not present

## 2018-03-01 DIAGNOSIS — E119 Type 2 diabetes mellitus without complications: Secondary | ICD-10-CM | POA: Diagnosis not present

## 2018-03-01 DIAGNOSIS — E538 Deficiency of other specified B group vitamins: Secondary | ICD-10-CM | POA: Diagnosis not present

## 2018-03-01 DIAGNOSIS — M5030 Other cervical disc degeneration, unspecified cervical region: Secondary | ICD-10-CM | POA: Diagnosis not present

## 2018-03-01 DIAGNOSIS — D649 Anemia, unspecified: Secondary | ICD-10-CM | POA: Diagnosis not present

## 2018-03-18 DIAGNOSIS — G4733 Obstructive sleep apnea (adult) (pediatric): Secondary | ICD-10-CM | POA: Diagnosis not present

## 2018-03-18 DIAGNOSIS — J0101 Acute recurrent maxillary sinusitis: Secondary | ICD-10-CM | POA: Diagnosis not present

## 2018-03-22 DIAGNOSIS — D649 Anemia, unspecified: Secondary | ICD-10-CM | POA: Diagnosis not present

## 2018-03-28 NOTE — Progress Notes (Signed)
Cardiology Office Note   Date:  03/29/2018   ID:  Pamela Murray, DOB 1951/03/13, MRN [id number]  PCP:  Lujean Amel, MD  Cardiologist:   No primary care provider on file. Referring:  Lujean Amel, MD  Chief Complaint  Patient presents with  . Palpitations      History of Present Illness: Pamela Murray is a 66 y.o. female who is referred by Lujean Amel, MD for evaluation of palpitations.  Patient has no past cardiac history.  For about 2 years she has had episodes of her heart racing and getting slightly dizzy.  However, this is increasing in frequency duration and intensity.  It happens several times per week.  She will be sitting or standing.  She will feel her heart beating fast.  It lasts for 3 to 4 minutes.  She will feel lightheaded with this but has not had syncope.  It goes away after several minutes and she did mention that she thought it tapered down.  She cannot bring it on and it does not go away with any specific activities.  She is not particularly active.  She lives alone and maintains her house.  With this she denies any chest pressure or arm discomfort.  However, she has constant neck and jaw pain.  She has trouble opening her mouth fully without causing jaw pain.  It is a shooting discomfort.  This does not have any occurrence with the palpitations.  She does have arthritis and has been told she has some spine problems.  Past Medical History:  Diagnosis Date  . Diabetes mellitus without complication ([place])   . Neuropathy   . Osteoarthritis   . Thyroid disease     Past Surgical History:  Procedure Laterality Date  . ABDOMINAL HYSTERECTOMY    . CHOLECYSTECTOMY    . HEMORRHOID SURGERY       Current Outpatient Medications  Medication Sig Dispense Refill  . cholecalciferol (VITAMIN D) 1000 units tablet Take 1,000 Units by mouth daily.    . cyanocobalamin (,VITAMIN B-12,) 1000 MCG/ML injection Inject 1,000 mcg into the muscle every 30 (thirty)  days.    Marland Kitchen dicyclomine (BENTYL) 10 MG capsule Take 10 mg by mouth 3 (three) times daily.     Marland Kitchen estradiol (ESTRACE) 1 MG tablet Take 1 mg by mouth daily.    . fexofenadine (KP FEXOFENADINE HCL) 180 MG tablet Take 180 mg by mouth daily.    . furosemide (LASIX) 20 MG tablet Take 20 mg by mouth.    Marland Kitchen HYDROcodone-acetaminophen (NORCO) 7.5-325 MG tablet Take 1 tablet by mouth 2 (two) times daily.     . Insulin Isophane & Regular Human (NOVOLIN 70/30 FLEXPEN) (70-30) 100 UNIT/ML PEN Inject into the skin. Use as directed    . levothyroxine (SYNTHROID, LEVOTHROID) 25 MCG tablet Take 25 mcg by mouth daily before breakfast.    . lisinopril (PRINIVIL,ZESTRIL) 5 MG tablet Take 5 mg by mouth daily.    . meloxicam (MOBIC) 7.5 MG tablet Take 7.5 mg by mouth daily.    . metFORMIN (GLUMETZA) 1000 MG (MOD) 24 hr tablet Take 1,000 mg by mouth 2 (two) times daily with a meal.     . PARoxetine (PAXIL-CR) 25 MG 24 hr tablet Take 25 mg by mouth at bedtime.     . RABEprazole (ACIPHEX) 20 MG tablet Take 20 mg by mouth daily.    . sitaGLIPtin (JANUVIA) 100 MG tablet Take 100 mg by mouth daily.    Marland Kitchen  sodium chloride (OCEAN) 0.65 % SOLN nasal spray Place 1 spray into both nostrils as needed for congestion.    . sucralfate (CARAFATE) 1 GM/10ML suspension Take 10 mLs (1 g total) by mouth 4 (four) times daily -  with meals and at bedtime. 420 mL 0   No current facility-administered medications for this visit.     Allergies:   Statins    Social History:  The patient  reports that she has never smoked. She has never used smokeless tobacco. She reports that she does not drink alcohol.   Family History:  The patient's family history includes Atrial fibrillation in her sister and sister; Breast cancer in her sister and sister; Clotting disorder in her sister; Diabetes in her father and mother; Heart disease in her father; Heart failure in her mother; Ovarian cancer in her mother; Stroke in her mother; Thyroid disease in her  mother.    ROS:  Please see the history of present illness.   Otherwise, review of systems are positive for none.   All other systems are reviewed and negative.    PHYSICAL EXAM: VS:  BP 132/80   Pulse 75   Ht 5\' 6"  (1.676 m)   Wt 216 lb (98 kg)   BMI 34.86 kg/m  , BMI Body mass index is 34.86 kg/m. GENERAL:  Well appearing HEENT:  Pupils equal round and reactive, fundi not visualized, oral mucosa unremarkable NECK:  No jugular venous distention, waveform within normal limits, carotid upstroke brisk and symmetric, no bruits, no thyromegaly LYMPHATICS:  No cervical, inguinal adenopathy LUNGS:  Clear to auscultation bilaterally BACK:  No CVA tenderness CHEST:  Unremarkable HEART:  PMI not displaced or sustained,S1 and S2 within normal limits, no S3, no S4, no clicks, no rubs, no murmurs ABD:  Flat, positive bowel sounds normal in frequency in pitch, no bruits, no rebound, no guarding, no midline pulsatile mass, no hepatomegaly, no splenomegaly EXT:  2 plus pulses throughout, no edema, no cyanosis no clubbing SKIN:  No rashes no nodules NEURO:  Cranial nerves II through XII grossly intact, motor grossly intact throughout PSYCH:  Cognitively intact, oriented to person place and time    EKG:  EKG is ordered today. The ekg ordered today demonstrates sinus rhythm, rate 75, axis within normal limits, intervals within normal limits, poor anterior R wave progression, nonspecific T wave flattening.   Recent Labs: 05/30/2017: ALT 45; BUN 16; Creatinine, Ser 1.20; Hemoglobin 14.5; Platelets 187; Potassium 4.3; Sodium 132    Lipid Panel No results found for: CHOL, TRIG, HDL, CHOLHDL, VLDL, LDLCALC, LDLDIRECT    Wt Readings from Last 3 Encounters:  03/29/18 216 lb (98 kg)  09/26/16 194 lb 9.6 oz (88.3 kg)      Other studies Reviewed: Additional studies/ records that were reviewed today include: Office record. Review of the above records demonstrates:  Please see elsewhere in the  note.     ASSESSMENT AND PLAN:  PALPITATIONS: I will start with a ZIO Patch.  I do note that her labs are fine including a TSH and electrolytes recently.  I will check an echocardiogram.  I will see her back after these results.  DM: A1c was 7.0.  She will continue the meds as listed.  JAW PAIN: This appears to be mechanical.  She is going to talk to her primary care doctor about referral possibly to oral surgery.   Current medicines are reviewed at length with the patient today.  The patient does not have concerns  regarding medicines.  The following changes have been made:  no change  Labs/ tests ordered today include:   Orders Placed This Encounter  Procedures  . LONG TERM MONITOR (3-14 DAYS)  . EKG 12-Lead  . ECHOCARDIOGRAM COMPLETE     Disposition:   FU with me after the monitor.     Signed, Minus Breeding, MD  03/29/2018 8:19 AM    [facility]

## 2018-03-29 ENCOUNTER — Encounter: Payer: Self-pay | Admitting: Cardiology

## 2018-03-29 ENCOUNTER — Ambulatory Visit: Payer: Medicare HMO | Admitting: Cardiology

## 2018-03-29 VITALS — BP 132/80 | HR 75 | Ht 66.0 in | Wt 216.0 lb

## 2018-03-29 DIAGNOSIS — R000 Tachycardia, unspecified: Secondary | ICD-10-CM

## 2018-03-29 DIAGNOSIS — R002 Palpitations: Secondary | ICD-10-CM | POA: Diagnosis not present

## 2018-03-29 NOTE — Patient Instructions (Signed)
Medication Instructions:  Continue current medications  If you need a refill on your cardiac medications before your next appointment, please call your pharmacy.  Labwork: None Ordered   Testing/Procedures: Your physician has recommended that you wear an zio monitor 14 days. Zio monitors are medical devices that record the heart's electrical activity. Doctors most often [place] these monitors to diagnose arrhythmias. Arrhythmias are problems with the speed or rhythm of the heartbeat. The monitor is a small, portable device. You can wear one while you do your normal daily activities. This is usually used to diagnose what is causing palpitations/syncope (passing out).  Your physician has requested that you have an echocardiogram. Echocardiography is a painless test that uses sound waves to create images of your heart. It provides your doctor with information about the size and shape of your heart and how well your heart's chambers and valves are working. This procedure takes approximately one hour. There are no restrictions for this procedure.   Follow-Up: Your physician wants you to follow-up in: 1 Month.       Thank you for choosing [facility] at [facility]!!

## 2018-04-02 DIAGNOSIS — Z23 Encounter for immunization: Secondary | ICD-10-CM | POA: Diagnosis not present

## 2018-04-08 ENCOUNTER — Other Ambulatory Visit (HOSPITAL_COMMUNITY): Payer: Medicare HMO

## 2018-04-08 DIAGNOSIS — H9201 Otalgia, right ear: Secondary | ICD-10-CM | POA: Diagnosis not present

## 2018-04-08 DIAGNOSIS — R6884 Jaw pain: Secondary | ICD-10-CM | POA: Diagnosis not present

## 2018-04-11 ENCOUNTER — Other Ambulatory Visit: Payer: Self-pay

## 2018-04-11 ENCOUNTER — Ambulatory Visit (HOSPITAL_COMMUNITY): Payer: Medicare HMO | Attending: Cardiovascular Disease

## 2018-04-11 DIAGNOSIS — R002 Palpitations: Secondary | ICD-10-CM | POA: Diagnosis not present

## 2018-04-11 DIAGNOSIS — R9431 Abnormal electrocardiogram [ECG] [EKG]: Secondary | ICD-10-CM | POA: Diagnosis not present

## 2018-04-11 DIAGNOSIS — Z8249 Family history of ischemic heart disease and other diseases of the circulatory system: Secondary | ICD-10-CM | POA: Diagnosis not present

## 2018-04-11 DIAGNOSIS — E119 Type 2 diabetes mellitus without complications: Secondary | ICD-10-CM | POA: Diagnosis not present

## 2018-04-11 DIAGNOSIS — R000 Tachycardia, unspecified: Secondary | ICD-10-CM | POA: Diagnosis not present

## 2018-04-17 ENCOUNTER — Ambulatory Visit (INDEPENDENT_AMBULATORY_CARE_PROVIDER_SITE_OTHER): Payer: Medicare HMO

## 2018-04-17 DIAGNOSIS — R002 Palpitations: Secondary | ICD-10-CM

## 2018-04-17 DIAGNOSIS — R000 Tachycardia, unspecified: Secondary | ICD-10-CM

## 2018-04-22 ENCOUNTER — Telehealth: Payer: Self-pay | Admitting: Cardiology

## 2018-04-22 NOTE — Telephone Encounter (Signed)
New Message   Patient is calling to obtain results for echocardiogram. Please call to discuss.

## 2018-04-22 NOTE — Telephone Encounter (Signed)
Spoke with pt, aware of echo results. 

## 2018-04-23 ENCOUNTER — Telehealth: Payer: Self-pay | Admitting: Cardiology

## 2018-04-23 NOTE — Telephone Encounter (Signed)
New Message   Pt is requesting to speak back with the nurse because she states she had to take her heart monitor off due to it giving her welps and she wants to know what she should do. Please call

## 2018-04-23 NOTE — Telephone Encounter (Signed)
Spoke with pt who states she has sensitive skin and the monitor has caused her to have whelps. She report it started as a rash on Wednesday and by Friday she had whelps. She states she has tried to use cream that was prescribed by her dermatology but it hasn't help. Pt states Sat evening she couldn't bare to wear it anymore and has since took it off. Routing to MD for recommendation.

## 2018-04-23 NOTE — Telephone Encounter (Signed)
There are hypoallergenic leads patches.

## 2018-04-23 NOTE — Telephone Encounter (Signed)
Left message to call back  

## 2018-04-24 NOTE — Telephone Encounter (Signed)
Pt updated with Dr. Percival Spanish recommendation and advised to contact company. Pt verbalized understanding.

## 2018-04-26 ENCOUNTER — Ambulatory Visit: Payer: Medicare HMO | Admitting: Cardiology

## 2018-05-01 DIAGNOSIS — E538 Deficiency of other specified B group vitamins: Secondary | ICD-10-CM | POA: Diagnosis not present

## 2018-05-10 DIAGNOSIS — R002 Palpitations: Secondary | ICD-10-CM | POA: Diagnosis not present

## 2018-05-12 NOTE — Progress Notes (Deleted)
Cardiology Office Note   Date:  05/12/2018   ID:  Pamela Murray, DOB 01/06/1951, MRN [id number]  PCP:  Lujean Amel, MD  Cardiologist:   No primary care provider on file. Referring:  Lujean Amel, MD  No chief complaint on file.     History of Present Illness: Pamela Murray is a 67 y.o. female who is referred by Lujean Amel, MD for evaluation of palpitations.   After her first visit with me she wore an event monitor but could not tolerate it because of allergy.  She had an echo which was unremarkable.  ***    Patient has no past cardiac history.  For about 2 years she has had episodes of her heart racing and getting slightly dizzy.  However, this is increasing in frequency duration and intensity.  It happens several times per week.  She will be sitting or standing.  She will feel her heart beating fast.  It lasts for 3 to 4 minutes.  She will feel lightheaded with this but has not had syncope.  It goes away after several minutes and she did mention that she thought it tapered down.  She cannot bring it on and it does not go away with any specific activities.  She is not particularly active.  She lives alone and maintains her house.  With this she denies any chest pressure or arm discomfort.  However, she has constant neck and jaw pain.  She has trouble opening her mouth fully without causing jaw pain.  It is a shooting discomfort.  This does not have any occurrence with the palpitations.  She does have arthritis and has been told she has some spine problems.  Past Medical History:  Diagnosis Date  . Diabetes mellitus without complication ([place])   . Neuropathy   . Osteoarthritis   . Thyroid disease     Past Surgical History:  Procedure Laterality Date  . ABDOMINAL HYSTERECTOMY    . CHOLECYSTECTOMY    . HEMORRHOID SURGERY       Current Outpatient Medications  Medication Sig Dispense Refill  . cholecalciferol (VITAMIN D) 1000 units tablet Take 1,000 Units by  mouth daily.    . cyanocobalamin (,VITAMIN B-12,) 1000 MCG/ML injection Inject 1,000 mcg into the muscle every 30 (thirty) days.    Marland Kitchen dicyclomine (BENTYL) 10 MG capsule Take 10 mg by mouth 3 (three) times daily.     Marland Kitchen estradiol (ESTRACE) 1 MG tablet Take 1 mg by mouth daily.    . fexofenadine (KP FEXOFENADINE HCL) 180 MG tablet Take 180 mg by mouth daily.    . furosemide (LASIX) 20 MG tablet Take 20 mg by mouth.    Marland Kitchen HYDROcodone-acetaminophen (NORCO) 7.5-325 MG tablet Take 1 tablet by mouth 2 (two) times daily.     . Insulin Isophane & Regular Human (NOVOLIN 70/30 FLEXPEN) (70-30) 100 UNIT/ML PEN Inject into the skin. Use as directed    . levothyroxine (SYNTHROID, LEVOTHROID) 25 MCG tablet Take 25 mcg by mouth daily before breakfast.    . lisinopril (PRINIVIL,ZESTRIL) 5 MG tablet Take 5 mg by mouth daily.    . meloxicam (MOBIC) 7.5 MG tablet Take 7.5 mg by mouth daily.    . metFORMIN (GLUMETZA) 1000 MG (MOD) 24 hr tablet Take 1,000 mg by mouth 2 (two) times daily with a meal.     . PARoxetine (PAXIL-CR) 25 MG 24 hr tablet Take 25 mg by mouth at bedtime.     . RABEprazole (ACIPHEX)  20 MG tablet Take 20 mg by mouth daily.    . sitaGLIPtin (JANUVIA) 100 MG tablet Take 100 mg by mouth daily.    . sodium chloride (OCEAN) 0.65 % SOLN nasal spray Place 1 spray into both nostrils as needed for congestion.    . sucralfate (CARAFATE) 1 GM/10ML suspension Take 10 mLs (1 g total) by mouth 4 (four) times daily -  with meals and at bedtime. 420 mL 0   No current facility-administered medications for this visit.     Allergies:   Statins    ROS:  Please see the history of present illness.   Otherwise, review of systems are positive for ***.   All other systems are reviewed and negative.    PHYSICAL EXAM: VS:  There were no vitals taken for this visit. , BMI There is no height or weight on file to calculate BMI. GENERAL:  Well appearing NECK:  No jugular venous distention, waveform within normal limits,  carotid upstroke brisk and symmetric, no bruits, no thyromegaly LUNGS:  Clear to auscultation bilaterally CHEST:  Unremarkable HEART:  PMI not displaced or sustained,S1 and S2 within normal limits, no S3, no S4, no clicks, no rubs, *** murmurs ABD:  Flat, positive bowel sounds normal in frequency in pitch, no bruits, no rebound, no guarding, no midline pulsatile mass, no hepatomegaly, no splenomegaly EXT:  2 plus pulses throughout, no edema, no cyanosis no clubbing    ***GENERAL:  Well appearing HEENT:  Pupils equal round and reactive, fundi not visualized, oral mucosa unremarkable NECK:  No jugular venous distention, waveform within normal limits, carotid upstroke brisk and symmetric, no bruits, no thyromegaly LYMPHATICS:  No cervical, inguinal adenopathy LUNGS:  Clear to auscultation bilaterally BACK:  No CVA tenderness CHEST:  Unremarkable HEART:  PMI not displaced or sustained,S1 and S2 within normal limits, no S3, no S4, no clicks, no rubs, no murmurs ABD:  Flat, positive bowel sounds normal in frequency in pitch, no bruits, no rebound, no guarding, no midline pulsatile mass, no hepatomegaly, no splenomegaly EXT:  2 plus pulses throughout, no edema, no cyanosis no clubbing SKIN:  No rashes no nodules NEURO:  Cranial nerves II through XII grossly intact, motor grossly intact throughout PSYCH:  Cognitively intact, oriented to person place and time    EKG:  EKG is *** ordered today. The ekg ordered today demonstrates sinus rhythm, rate ***, axis within normal limits, intervals within normal limits, poor anterior R wave progression, nonspecific T wave flattening.   Recent Labs: 05/30/2017: ALT 45; BUN 16; Creatinine, Ser 1.20; Hemoglobin 14.5; Platelets 187; Potassium 4.3; Sodium 132    Lipid Panel No results found for: CHOL, TRIG, HDL, CHOLHDL, VLDL, LDLCALC, LDLDIRECT    Wt Readings from Last 3 Encounters:  03/29/18 216 lb (98 kg)  09/26/16 194 lb 9.6 oz (88.3 kg)       Other studies Reviewed: Additional studies/ records that were reviewed today include: *** Review of the above records demonstrates:  ***   ASSESSMENT AND PLAN:  PALPITATIONS:  ***  I will start with a ZIO Patch.  I do note that her labs are fine including a TSH and electrolytes recently.  I will check an echocardiogram.  I will see her back after these results.  DM: A1c was 7.0.  *** She will continue the meds as listed.  JAW PAIN:    ***  This appears to be mechanical.  She is going to talk to her primary care doctor about referral  possibly to oral surgery.   Current medicines are reviewed at length with the patient today.  The patient does not have concerns regarding medicines.  The following changes have been made:  ***  Labs/ tests ordered today include: ***  No orders of the defined types were placed in this encounter.    Disposition:   FU with me ***   Signed, Minus Breeding, MD  05/12/2018 10:15 AM    [place]

## 2018-05-13 ENCOUNTER — Ambulatory Visit: Payer: Medicare HMO | Admitting: Cardiology

## 2018-05-21 DIAGNOSIS — J209 Acute bronchitis, unspecified: Secondary | ICD-10-CM | POA: Diagnosis not present

## 2018-05-21 DIAGNOSIS — J019 Acute sinusitis, unspecified: Secondary | ICD-10-CM | POA: Diagnosis not present

## 2018-05-28 ENCOUNTER — Telehealth: Payer: Self-pay | Admitting: Cardiology

## 2018-05-28 NOTE — Telephone Encounter (Signed)
Follow Up:     Returning your call from yesterday, concerning he rresults.

## 2018-05-29 DIAGNOSIS — E538 Deficiency of other specified B group vitamins: Secondary | ICD-10-CM | POA: Diagnosis not present

## 2018-05-29 NOTE — Progress Notes (Signed)
Cardiology Office Note   Date:  05/30/2018   ID:  Pamela Murray, DOB 07-May-1951, MRN [id number]  PCP:  Lujean Amel, MD  Cardiologist:   No primary care provider on file. Referring:  Lujean Amel, MD  Chief Complaint  Patient presents with  . Palpitations      History of Present Illness: Pamela Murray is a 67 y.o. female who is referred by Lujean Amel, MD for evaluation of palpitations.   After her first visit with me she wore an event monitor but could not tolerate it because of allergy.  She had an echo which was unremarkable.  Since I saw her she has had rare palpitations.  She can only wear the ZIO Patch for a couple of days because she developed an allergic reaction.  In that time she did have one run of SVT but she is not really sure that she is feeling this otherwise.  Is not having any palpitations, presyncope or syncope.  She is not having any chest pressure, neck or arm discomfort.  Her biggest complaint is leg soreness.  She feels like she has some swelling in her legs.  She has occasional orthostatic symptoms.  She denies any presyncope or syncope.   Past Medical History:  Diagnosis Date  . Diabetes mellitus without complication ([place])   . Neuropathy   . Osteoarthritis   . Thyroid disease     Past Surgical History:  Procedure Laterality Date  . ABDOMINAL HYSTERECTOMY    . CHOLECYSTECTOMY    . HEMORRHOID SURGERY       Current Outpatient Medications  Medication Sig Dispense Refill  . cholecalciferol (VITAMIN D) 1000 units tablet Take 1,000 Units by mouth daily.    . cyanocobalamin (,VITAMIN B-12,) 1000 MCG/ML injection Inject 1,000 mcg into the muscle every 30 (thirty) days.    Marland Kitchen dicyclomine (BENTYL) 10 MG capsule Take 10 mg by mouth 3 (three) times daily.     Marland Kitchen estradiol (ESTRACE) 1 MG tablet Take 1 mg by mouth daily.    . fexofenadine (KP FEXOFENADINE HCL) 180 MG tablet Take 180 mg by mouth daily.    . furosemide (LASIX) 20 MG tablet Take  20 mg by mouth.    Marland Kitchen HYDROcodone-acetaminophen (NORCO) 7.5-325 MG tablet Take 1 tablet by mouth 2 (two) times daily.     . Insulin Isophane & Regular Human (NOVOLIN 70/30 FLEXPEN) (70-30) 100 UNIT/ML PEN Inject into the skin. Use as directed    . levothyroxine (SYNTHROID, LEVOTHROID) 25 MCG tablet Take 25 mcg by mouth daily before breakfast.    . lisinopril (PRINIVIL,ZESTRIL) 5 MG tablet Take 5 mg by mouth daily.    . meloxicam (MOBIC) 7.5 MG tablet Take 7.5 mg by mouth daily.    . metFORMIN (GLUMETZA) 1000 MG (MOD) 24 hr tablet Take 1,000 mg by mouth 2 (two) times daily with a meal.     . PARoxetine (PAXIL-CR) 25 MG 24 hr tablet Take 25 mg by mouth at bedtime.     . RABEprazole (ACIPHEX) 20 MG tablet Take 20 mg by mouth daily.    . sitaGLIPtin (JANUVIA) 100 MG tablet Take 100 mg by mouth daily.    . sodium chloride (OCEAN) 0.65 % SOLN nasal spray Place 1 spray into both nostrils as needed for congestion.     No current facility-administered medications for this visit.     Allergies:   Statins    ROS:  Please see the history of present illness.  Otherwise, review of systems are positive for none.   All other systems are reviewed and negative.    PHYSICAL EXAM: VS:  BP 123/70   Pulse 74   Ht 5\' 6"  (1.676 m)   Wt 208 lb (94.3 kg)   SpO2 96%   BMI 33.57 kg/m  , BMI Body mass index is 33.57 kg/m. GENERAL:  Well appearing NECK:  No jugular venous distention, waveform within normal limits, carotid upstroke brisk and symmetric, no bruits, no thyromegaly LUNGS:  Clear to auscultation bilaterally CHEST:  Unremarkable HEART:  PMI not displaced or sustained,S1 and S2 within normal limits, no S3, no S4, no clicks, no rubs, no murmurs ABD:  Flat, positive bowel sounds normal in frequency in pitch, no bruits, no rebound, no guarding, no midline pulsatile mass, no hepatomegaly, no splenomegaly EXT:  2 plus pulses throughout, no edema, no cyanosis no clubbing   EKG:  EKG is not ordered  today.   Recent Labs: 05/30/2017: ALT 45; BUN 16; Creatinine, Ser 1.20; Hemoglobin 14.5; Platelets 187; Potassium 4.3; Sodium 132    Lipid Panel No results found for: CHOL, TRIG, HDL, CHOLHDL, VLDL, LDLCALC, LDLDIRECT    Wt Readings from Last 3 Encounters:  05/30/18 208 lb (94.3 kg)  03/29/18 216 lb (98 kg)  09/26/16 194 lb 9.6 oz (88.3 kg)      Other studies Reviewed: Additional studies/ records that were reviewed today include: ZIO patch Review of the above records demonstrates:     ASSESSMENT AND PLAN:   PALPITATIONS:   She is not particularly symptomatic with palpitations.  No change in therapy is suggested and she will let me know if there causing her any increased symptoms in the future and I would manage these medically.  Electrolytes and TSH were normal.   DM: A1c was 7.0.  She will continue with meds as listed.   JAW PAIN:    She did have this evaluated by her dentist and ENT and she says is not particularly bothering her.  No further work-up.  I do not think this is an anginal equivalent.   LEG PAIN: I do not believe this to be vascular.  I do not see any edema and there is no suggestion of heart failure.  She may have some neuropathic pain pain related to back or other neurologic/orthopedic problems.  No further cardiac work-up.  She will discuss this further with her endocrinologist.  Current medicines are reviewed at length with the patient today.  The patient does not have concerns regarding medicines.  The following changes have been made:   None  Labs/ tests ordered today include: None  No orders of the defined types were placed in this encounter.    Disposition:   FU with me as needed.    Signed, Minus Breeding, MD  05/30/2018 11:15 AM    [place]

## 2018-05-30 ENCOUNTER — Ambulatory Visit: Payer: Medicare HMO | Admitting: Cardiology

## 2018-05-30 ENCOUNTER — Encounter: Payer: Self-pay | Admitting: Cardiology

## 2018-05-30 VITALS — BP 123/70 | HR 74 | Ht 66.0 in | Wt 208.0 lb

## 2018-05-30 DIAGNOSIS — R002 Palpitations: Secondary | ICD-10-CM | POA: Diagnosis not present

## 2018-05-30 DIAGNOSIS — M79604 Pain in right leg: Secondary | ICD-10-CM

## 2018-05-30 DIAGNOSIS — M79605 Pain in left leg: Secondary | ICD-10-CM | POA: Diagnosis not present

## 2018-05-30 NOTE — Patient Instructions (Signed)

## 2018-05-30 NOTE — Telephone Encounter (Signed)
Result went over at pt appt today

## 2018-06-20 DIAGNOSIS — G4733 Obstructive sleep apnea (adult) (pediatric): Secondary | ICD-10-CM | POA: Diagnosis not present

## 2018-06-26 DIAGNOSIS — E538 Deficiency of other specified B group vitamins: Secondary | ICD-10-CM | POA: Diagnosis not present

## 2018-06-28 DIAGNOSIS — D2239 Melanocytic nevi of other parts of face: Secondary | ICD-10-CM | POA: Diagnosis not present

## 2018-06-28 DIAGNOSIS — D225 Melanocytic nevi of trunk: Secondary | ICD-10-CM | POA: Diagnosis not present

## 2018-06-28 DIAGNOSIS — D1801 Hemangioma of skin and subcutaneous tissue: Secondary | ICD-10-CM | POA: Diagnosis not present

## 2018-06-28 DIAGNOSIS — L821 Other seborrheic keratosis: Secondary | ICD-10-CM | POA: Diagnosis not present

## 2018-06-28 DIAGNOSIS — L814 Other melanin hyperpigmentation: Secondary | ICD-10-CM | POA: Diagnosis not present

## 2018-06-28 DIAGNOSIS — D224 Melanocytic nevi of scalp and neck: Secondary | ICD-10-CM | POA: Diagnosis not present

## 2018-06-28 DIAGNOSIS — L570 Actinic keratosis: Secondary | ICD-10-CM | POA: Diagnosis not present

## 2018-07-17 DIAGNOSIS — E1165 Type 2 diabetes mellitus with hyperglycemia: Secondary | ICD-10-CM | POA: Diagnosis not present

## 2018-07-17 DIAGNOSIS — E7800 Pure hypercholesterolemia, unspecified: Secondary | ICD-10-CM | POA: Diagnosis not present

## 2018-07-17 DIAGNOSIS — E039 Hypothyroidism, unspecified: Secondary | ICD-10-CM | POA: Diagnosis not present

## 2018-07-17 DIAGNOSIS — E538 Deficiency of other specified B group vitamins: Secondary | ICD-10-CM | POA: Diagnosis not present

## 2018-07-22 DIAGNOSIS — E119 Type 2 diabetes mellitus without complications: Secondary | ICD-10-CM | POA: Diagnosis not present

## 2018-07-24 DIAGNOSIS — E1165 Type 2 diabetes mellitus with hyperglycemia: Secondary | ICD-10-CM | POA: Diagnosis not present

## 2018-07-24 DIAGNOSIS — E538 Deficiency of other specified B group vitamins: Secondary | ICD-10-CM | POA: Diagnosis not present

## 2018-07-24 DIAGNOSIS — E039 Hypothyroidism, unspecified: Secondary | ICD-10-CM | POA: Diagnosis not present

## 2018-07-24 DIAGNOSIS — I10 Essential (primary) hypertension: Secondary | ICD-10-CM | POA: Diagnosis not present

## 2018-07-24 DIAGNOSIS — E049 Nontoxic goiter, unspecified: Secondary | ICD-10-CM | POA: Diagnosis not present

## 2018-07-24 DIAGNOSIS — E7800 Pure hypercholesterolemia, unspecified: Secondary | ICD-10-CM | POA: Diagnosis not present

## 2018-07-31 DIAGNOSIS — E538 Deficiency of other specified B group vitamins: Secondary | ICD-10-CM | POA: Diagnosis not present

## 2018-08-27 DIAGNOSIS — E538 Deficiency of other specified B group vitamins: Secondary | ICD-10-CM | POA: Diagnosis not present

## 2018-09-02 DIAGNOSIS — M5030 Other cervical disc degeneration, unspecified cervical region: Secondary | ICD-10-CM | POA: Diagnosis not present

## 2018-09-02 DIAGNOSIS — T466X5A Adverse effect of antihyperlipidemic and antiarteriosclerotic drugs, initial encounter: Secondary | ICD-10-CM | POA: Diagnosis not present

## 2018-09-02 DIAGNOSIS — Z79899 Other long term (current) drug therapy: Secondary | ICD-10-CM | POA: Diagnosis not present

## 2018-09-02 DIAGNOSIS — M609 Myositis, unspecified: Secondary | ICD-10-CM | POA: Diagnosis not present

## 2018-09-02 DIAGNOSIS — G894 Chronic pain syndrome: Secondary | ICD-10-CM | POA: Diagnosis not present

## 2018-09-02 DIAGNOSIS — E119 Type 2 diabetes mellitus without complications: Secondary | ICD-10-CM | POA: Diagnosis not present

## 2018-09-02 DIAGNOSIS — E611 Iron deficiency: Secondary | ICD-10-CM | POA: Diagnosis not present

## 2018-09-02 DIAGNOSIS — F411 Generalized anxiety disorder: Secondary | ICD-10-CM | POA: Diagnosis not present

## 2018-09-02 DIAGNOSIS — E538 Deficiency of other specified B group vitamins: Secondary | ICD-10-CM | POA: Diagnosis not present

## 2018-09-18 DIAGNOSIS — Z01419 Encounter for gynecological examination (general) (routine) without abnormal findings: Secondary | ICD-10-CM | POA: Diagnosis not present

## 2018-09-18 DIAGNOSIS — Z1231 Encounter for screening mammogram for malignant neoplasm of breast: Secondary | ICD-10-CM | POA: Diagnosis not present

## 2018-09-19 DIAGNOSIS — K589 Irritable bowel syndrome without diarrhea: Secondary | ICD-10-CM | POA: Diagnosis not present

## 2018-09-19 DIAGNOSIS — K219 Gastro-esophageal reflux disease without esophagitis: Secondary | ICD-10-CM | POA: Diagnosis not present

## 2018-09-19 DIAGNOSIS — E611 Iron deficiency: Secondary | ICD-10-CM | POA: Diagnosis not present

## 2018-09-20 DIAGNOSIS — G4733 Obstructive sleep apnea (adult) (pediatric): Secondary | ICD-10-CM | POA: Diagnosis not present

## 2018-10-01 DIAGNOSIS — E538 Deficiency of other specified B group vitamins: Secondary | ICD-10-CM | POA: Diagnosis not present

## 2018-10-28 DIAGNOSIS — E538 Deficiency of other specified B group vitamins: Secondary | ICD-10-CM | POA: Diagnosis not present

## 2018-11-18 DIAGNOSIS — E1165 Type 2 diabetes mellitus with hyperglycemia: Secondary | ICD-10-CM | POA: Diagnosis not present

## 2018-11-18 DIAGNOSIS — I10 Essential (primary) hypertension: Secondary | ICD-10-CM | POA: Diagnosis not present

## 2018-11-18 DIAGNOSIS — E039 Hypothyroidism, unspecified: Secondary | ICD-10-CM | POA: Diagnosis not present

## 2018-11-18 DIAGNOSIS — E538 Deficiency of other specified B group vitamins: Secondary | ICD-10-CM | POA: Diagnosis not present

## 2018-11-18 DIAGNOSIS — E049 Nontoxic goiter, unspecified: Secondary | ICD-10-CM | POA: Diagnosis not present

## 2018-11-18 DIAGNOSIS — E7800 Pure hypercholesterolemia, unspecified: Secondary | ICD-10-CM | POA: Diagnosis not present

## 2018-11-21 DIAGNOSIS — K317 Polyp of stomach and duodenum: Secondary | ICD-10-CM | POA: Diagnosis not present

## 2018-11-21 DIAGNOSIS — K449 Diaphragmatic hernia without obstruction or gangrene: Secondary | ICD-10-CM | POA: Diagnosis not present

## 2018-11-21 DIAGNOSIS — D509 Iron deficiency anemia, unspecified: Secondary | ICD-10-CM | POA: Diagnosis not present

## 2018-11-27 DIAGNOSIS — E538 Deficiency of other specified B group vitamins: Secondary | ICD-10-CM | POA: Diagnosis not present

## 2018-12-20 DIAGNOSIS — G4733 Obstructive sleep apnea (adult) (pediatric): Secondary | ICD-10-CM | POA: Diagnosis not present

## 2018-12-31 DIAGNOSIS — E538 Deficiency of other specified B group vitamins: Secondary | ICD-10-CM | POA: Diagnosis not present

## 2019-01-27 DIAGNOSIS — E538 Deficiency of other specified B group vitamins: Secondary | ICD-10-CM | POA: Diagnosis not present

## 2019-02-25 DIAGNOSIS — E538 Deficiency of other specified B group vitamins: Secondary | ICD-10-CM | POA: Diagnosis not present

## 2019-03-03 DIAGNOSIS — F411 Generalized anxiety disorder: Secondary | ICD-10-CM | POA: Diagnosis not present

## 2019-03-03 DIAGNOSIS — D649 Anemia, unspecified: Secondary | ICD-10-CM | POA: Diagnosis not present

## 2019-03-03 DIAGNOSIS — I10 Essential (primary) hypertension: Secondary | ICD-10-CM | POA: Diagnosis not present

## 2019-03-03 DIAGNOSIS — E119 Type 2 diabetes mellitus without complications: Secondary | ICD-10-CM | POA: Diagnosis not present

## 2019-03-03 DIAGNOSIS — M5136 Other intervertebral disc degeneration, lumbar region: Secondary | ICD-10-CM | POA: Diagnosis not present

## 2019-03-03 DIAGNOSIS — E538 Deficiency of other specified B group vitamins: Secondary | ICD-10-CM | POA: Diagnosis not present

## 2019-03-03 DIAGNOSIS — G894 Chronic pain syndrome: Secondary | ICD-10-CM | POA: Diagnosis not present

## 2019-03-03 DIAGNOSIS — Z794 Long term (current) use of insulin: Secondary | ICD-10-CM | POA: Diagnosis not present

## 2019-03-03 DIAGNOSIS — M5030 Other cervical disc degeneration, unspecified cervical region: Secondary | ICD-10-CM | POA: Diagnosis not present

## 2019-03-14 DIAGNOSIS — E1165 Type 2 diabetes mellitus with hyperglycemia: Secondary | ICD-10-CM | POA: Diagnosis not present

## 2019-03-14 DIAGNOSIS — E039 Hypothyroidism, unspecified: Secondary | ICD-10-CM | POA: Diagnosis not present

## 2019-03-14 DIAGNOSIS — E7800 Pure hypercholesterolemia, unspecified: Secondary | ICD-10-CM | POA: Diagnosis not present

## 2019-03-18 DIAGNOSIS — G894 Chronic pain syndrome: Secondary | ICD-10-CM | POA: Diagnosis not present

## 2019-03-18 DIAGNOSIS — E538 Deficiency of other specified B group vitamins: Secondary | ICD-10-CM | POA: Diagnosis not present

## 2019-03-18 DIAGNOSIS — E611 Iron deficiency: Secondary | ICD-10-CM | POA: Diagnosis not present

## 2019-03-18 DIAGNOSIS — Z79899 Other long term (current) drug therapy: Secondary | ICD-10-CM | POA: Diagnosis not present

## 2019-03-18 DIAGNOSIS — I10 Essential (primary) hypertension: Secondary | ICD-10-CM | POA: Diagnosis not present

## 2019-03-19 ENCOUNTER — Other Ambulatory Visit: Payer: Self-pay | Admitting: Family Medicine

## 2019-03-19 DIAGNOSIS — E2839 Other primary ovarian failure: Secondary | ICD-10-CM | POA: Diagnosis not present

## 2019-03-19 DIAGNOSIS — Z0001 Encounter for general adult medical examination with abnormal findings: Secondary | ICD-10-CM | POA: Diagnosis not present

## 2019-03-19 DIAGNOSIS — E538 Deficiency of other specified B group vitamins: Secondary | ICD-10-CM | POA: Diagnosis not present

## 2019-03-19 DIAGNOSIS — E119 Type 2 diabetes mellitus without complications: Secondary | ICD-10-CM | POA: Diagnosis not present

## 2019-03-19 DIAGNOSIS — F411 Generalized anxiety disorder: Secondary | ICD-10-CM | POA: Diagnosis not present

## 2019-03-19 DIAGNOSIS — M5030 Other cervical disc degeneration, unspecified cervical region: Secondary | ICD-10-CM | POA: Diagnosis not present

## 2019-03-19 DIAGNOSIS — G894 Chronic pain syndrome: Secondary | ICD-10-CM | POA: Diagnosis not present

## 2019-03-19 DIAGNOSIS — I10 Essential (primary) hypertension: Secondary | ICD-10-CM | POA: Diagnosis not present

## 2019-03-19 DIAGNOSIS — M5136 Other intervertebral disc degeneration, lumbar region: Secondary | ICD-10-CM | POA: Diagnosis not present

## 2019-03-19 DIAGNOSIS — R1032 Left lower quadrant pain: Secondary | ICD-10-CM

## 2019-03-21 ENCOUNTER — Other Ambulatory Visit: Payer: Self-pay | Admitting: Family Medicine

## 2019-03-21 DIAGNOSIS — I10 Essential (primary) hypertension: Secondary | ICD-10-CM | POA: Diagnosis not present

## 2019-03-21 DIAGNOSIS — E2839 Other primary ovarian failure: Secondary | ICD-10-CM

## 2019-03-21 DIAGNOSIS — E7800 Pure hypercholesterolemia, unspecified: Secondary | ICD-10-CM | POA: Diagnosis not present

## 2019-03-21 DIAGNOSIS — E1165 Type 2 diabetes mellitus with hyperglycemia: Secondary | ICD-10-CM | POA: Diagnosis not present

## 2019-03-21 DIAGNOSIS — E039 Hypothyroidism, unspecified: Secondary | ICD-10-CM | POA: Diagnosis not present

## 2019-03-21 DIAGNOSIS — E049 Nontoxic goiter, unspecified: Secondary | ICD-10-CM | POA: Diagnosis not present

## 2019-03-21 DIAGNOSIS — E538 Deficiency of other specified B group vitamins: Secondary | ICD-10-CM | POA: Diagnosis not present

## 2019-03-28 DIAGNOSIS — G4733 Obstructive sleep apnea (adult) (pediatric): Secondary | ICD-10-CM | POA: Diagnosis not present

## 2019-04-01 DIAGNOSIS — E538 Deficiency of other specified B group vitamins: Secondary | ICD-10-CM | POA: Diagnosis not present

## 2019-04-29 DIAGNOSIS — E538 Deficiency of other specified B group vitamins: Secondary | ICD-10-CM | POA: Diagnosis not present

## 2019-05-14 ENCOUNTER — Other Ambulatory Visit: Payer: Medicare HMO

## 2019-05-27 DIAGNOSIS — E538 Deficiency of other specified B group vitamins: Secondary | ICD-10-CM | POA: Diagnosis not present

## 2019-06-27 DIAGNOSIS — E538 Deficiency of other specified B group vitamins: Secondary | ICD-10-CM | POA: Diagnosis not present

## 2019-07-05 DIAGNOSIS — G4733 Obstructive sleep apnea (adult) (pediatric): Secondary | ICD-10-CM | POA: Diagnosis not present

## 2019-07-07 DIAGNOSIS — D1801 Hemangioma of skin and subcutaneous tissue: Secondary | ICD-10-CM | POA: Diagnosis not present

## 2019-07-07 DIAGNOSIS — D2272 Melanocytic nevi of left lower limb, including hip: Secondary | ICD-10-CM | POA: Diagnosis not present

## 2019-07-07 DIAGNOSIS — D225 Melanocytic nevi of trunk: Secondary | ICD-10-CM | POA: Diagnosis not present

## 2019-07-07 DIAGNOSIS — D224 Melanocytic nevi of scalp and neck: Secondary | ICD-10-CM | POA: Diagnosis not present

## 2019-07-07 DIAGNOSIS — D2239 Melanocytic nevi of other parts of face: Secondary | ICD-10-CM | POA: Diagnosis not present

## 2019-07-07 DIAGNOSIS — L821 Other seborrheic keratosis: Secondary | ICD-10-CM | POA: Diagnosis not present

## 2019-07-07 DIAGNOSIS — D2262 Melanocytic nevi of left upper limb, including shoulder: Secondary | ICD-10-CM | POA: Diagnosis not present

## 2019-07-07 DIAGNOSIS — L738 Other specified follicular disorders: Secondary | ICD-10-CM | POA: Diagnosis not present

## 2019-07-07 DIAGNOSIS — L814 Other melanin hyperpigmentation: Secondary | ICD-10-CM | POA: Diagnosis not present

## 2019-07-22 DIAGNOSIS — M5136 Other intervertebral disc degeneration, lumbar region: Secondary | ICD-10-CM | POA: Diagnosis not present

## 2019-07-22 DIAGNOSIS — G894 Chronic pain syndrome: Secondary | ICD-10-CM | POA: Diagnosis not present

## 2019-07-22 DIAGNOSIS — Z794 Long term (current) use of insulin: Secondary | ICD-10-CM | POA: Diagnosis not present

## 2019-07-22 DIAGNOSIS — M5030 Other cervical disc degeneration, unspecified cervical region: Secondary | ICD-10-CM | POA: Diagnosis not present

## 2019-07-22 DIAGNOSIS — F411 Generalized anxiety disorder: Secondary | ICD-10-CM | POA: Diagnosis not present

## 2019-07-22 DIAGNOSIS — I10 Essential (primary) hypertension: Secondary | ICD-10-CM | POA: Diagnosis not present

## 2019-07-22 DIAGNOSIS — E119 Type 2 diabetes mellitus without complications: Secondary | ICD-10-CM | POA: Diagnosis not present

## 2019-07-22 DIAGNOSIS — E538 Deficiency of other specified B group vitamins: Secondary | ICD-10-CM | POA: Diagnosis not present

## 2019-07-25 DIAGNOSIS — E039 Hypothyroidism, unspecified: Secondary | ICD-10-CM | POA: Diagnosis not present

## 2019-07-25 DIAGNOSIS — E538 Deficiency of other specified B group vitamins: Secondary | ICD-10-CM | POA: Diagnosis not present

## 2019-07-25 DIAGNOSIS — E7800 Pure hypercholesterolemia, unspecified: Secondary | ICD-10-CM | POA: Diagnosis not present

## 2019-07-25 DIAGNOSIS — E1165 Type 2 diabetes mellitus with hyperglycemia: Secondary | ICD-10-CM | POA: Diagnosis not present

## 2019-07-28 DIAGNOSIS — E538 Deficiency of other specified B group vitamins: Secondary | ICD-10-CM | POA: Diagnosis not present

## 2019-07-31 DIAGNOSIS — E119 Type 2 diabetes mellitus without complications: Secondary | ICD-10-CM | POA: Diagnosis not present

## 2019-08-01 DIAGNOSIS — E7800 Pure hypercholesterolemia, unspecified: Secondary | ICD-10-CM | POA: Diagnosis not present

## 2019-08-01 DIAGNOSIS — E039 Hypothyroidism, unspecified: Secondary | ICD-10-CM | POA: Diagnosis not present

## 2019-08-01 DIAGNOSIS — E1165 Type 2 diabetes mellitus with hyperglycemia: Secondary | ICD-10-CM | POA: Diagnosis not present

## 2019-08-01 DIAGNOSIS — I10 Essential (primary) hypertension: Secondary | ICD-10-CM | POA: Diagnosis not present

## 2019-08-05 ENCOUNTER — Other Ambulatory Visit: Payer: Medicare HMO

## 2019-08-07 ENCOUNTER — Other Ambulatory Visit: Payer: Self-pay | Admitting: Endocrinology

## 2019-08-07 DIAGNOSIS — E049 Nontoxic goiter, unspecified: Secondary | ICD-10-CM

## 2019-08-14 DIAGNOSIS — Z0100 Encounter for examination of eyes and vision without abnormal findings: Secondary | ICD-10-CM | POA: Diagnosis not present

## 2019-09-01 DIAGNOSIS — E538 Deficiency of other specified B group vitamins: Secondary | ICD-10-CM | POA: Diagnosis not present

## 2019-09-23 DIAGNOSIS — Z01419 Encounter for gynecological examination (general) (routine) without abnormal findings: Secondary | ICD-10-CM | POA: Diagnosis not present

## 2019-09-23 DIAGNOSIS — Z1231 Encounter for screening mammogram for malignant neoplasm of breast: Secondary | ICD-10-CM | POA: Diagnosis not present

## 2019-10-01 DIAGNOSIS — E538 Deficiency of other specified B group vitamins: Secondary | ICD-10-CM | POA: Diagnosis not present

## 2019-10-06 DIAGNOSIS — G4733 Obstructive sleep apnea (adult) (pediatric): Secondary | ICD-10-CM | POA: Diagnosis not present

## 2019-10-28 DIAGNOSIS — E538 Deficiency of other specified B group vitamins: Secondary | ICD-10-CM | POA: Diagnosis not present

## 2019-11-19 ENCOUNTER — Other Ambulatory Visit: Payer: Self-pay

## 2019-11-19 ENCOUNTER — Ambulatory Visit
Admission: RE | Admit: 2019-11-19 | Discharge: 2019-11-19 | Disposition: A | Discharge status: Home / Self Care | Payer: Medicare HMO | Source: Ambulatory Visit | Attending: Endocrinology | Admitting: Endocrinology

## 2019-11-19 DIAGNOSIS — E041 Nontoxic single thyroid nodule: Secondary | ICD-10-CM | POA: Diagnosis not present

## 2019-11-19 DIAGNOSIS — E049 Nontoxic goiter, unspecified: Secondary | ICD-10-CM

## 2019-11-27 DIAGNOSIS — E538 Deficiency of other specified B group vitamins: Secondary | ICD-10-CM | POA: Diagnosis not present

## 2019-11-28 DIAGNOSIS — E7800 Pure hypercholesterolemia, unspecified: Secondary | ICD-10-CM | POA: Diagnosis not present

## 2019-11-28 DIAGNOSIS — E039 Hypothyroidism, unspecified: Secondary | ICD-10-CM | POA: Diagnosis not present

## 2019-11-28 DIAGNOSIS — E1165 Type 2 diabetes mellitus with hyperglycemia: Secondary | ICD-10-CM | POA: Diagnosis not present

## 2019-11-28 DIAGNOSIS — I10 Essential (primary) hypertension: Secondary | ICD-10-CM | POA: Diagnosis not present

## 2019-12-03 DIAGNOSIS — K2270 Barrett's esophagus without dysplasia: Secondary | ICD-10-CM | POA: Diagnosis not present

## 2019-12-03 DIAGNOSIS — K589 Irritable bowel syndrome without diarrhea: Secondary | ICD-10-CM | POA: Diagnosis not present

## 2019-12-03 DIAGNOSIS — R1310 Dysphagia, unspecified: Secondary | ICD-10-CM | POA: Diagnosis not present

## 2019-12-10 DIAGNOSIS — L0292 Furuncle, unspecified: Secondary | ICD-10-CM | POA: Diagnosis not present

## 2019-12-30 DIAGNOSIS — E538 Deficiency of other specified B group vitamins: Secondary | ICD-10-CM | POA: Diagnosis not present

## 2020-01-27 DIAGNOSIS — E538 Deficiency of other specified B group vitamins: Secondary | ICD-10-CM | POA: Diagnosis not present

## 2020-02-19 DIAGNOSIS — D649 Anemia, unspecified: Secondary | ICD-10-CM | POA: Diagnosis not present

## 2020-02-19 DIAGNOSIS — M5030 Other cervical disc degeneration, unspecified cervical region: Secondary | ICD-10-CM | POA: Diagnosis not present

## 2020-02-19 DIAGNOSIS — Z79899 Other long term (current) drug therapy: Secondary | ICD-10-CM | POA: Diagnosis not present

## 2020-02-19 DIAGNOSIS — E538 Deficiency of other specified B group vitamins: Secondary | ICD-10-CM | POA: Diagnosis not present

## 2020-02-19 DIAGNOSIS — E7800 Pure hypercholesterolemia, unspecified: Secondary | ICD-10-CM | POA: Diagnosis not present

## 2020-02-19 DIAGNOSIS — E2839 Other primary ovarian failure: Secondary | ICD-10-CM | POA: Diagnosis not present

## 2020-02-19 DIAGNOSIS — M5136 Other intervertebral disc degeneration, lumbar region: Secondary | ICD-10-CM | POA: Diagnosis not present

## 2020-02-19 DIAGNOSIS — G894 Chronic pain syndrome: Secondary | ICD-10-CM | POA: Diagnosis not present

## 2020-02-19 DIAGNOSIS — Z0001 Encounter for general adult medical examination with abnormal findings: Secondary | ICD-10-CM | POA: Diagnosis not present

## 2020-02-19 DIAGNOSIS — E1169 Type 2 diabetes mellitus with other specified complication: Secondary | ICD-10-CM | POA: Diagnosis not present

## 2020-02-19 DIAGNOSIS — E039 Hypothyroidism, unspecified: Secondary | ICD-10-CM | POA: Diagnosis not present

## 2020-02-19 DIAGNOSIS — I10 Essential (primary) hypertension: Secondary | ICD-10-CM | POA: Diagnosis not present

## 2020-02-23 ENCOUNTER — Other Ambulatory Visit: Payer: Self-pay | Admitting: Family Medicine

## 2020-02-23 DIAGNOSIS — E2839 Other primary ovarian failure: Secondary | ICD-10-CM

## 2020-02-26 DIAGNOSIS — G4733 Obstructive sleep apnea (adult) (pediatric): Secondary | ICD-10-CM | POA: Diagnosis not present

## 2020-03-02 DIAGNOSIS — E538 Deficiency of other specified B group vitamins: Secondary | ICD-10-CM | POA: Diagnosis not present

## 2020-03-24 DIAGNOSIS — Z20822 Contact with and (suspected) exposure to covid-19: Secondary | ICD-10-CM | POA: Diagnosis not present

## 2020-03-30 DIAGNOSIS — E538 Deficiency of other specified B group vitamins: Secondary | ICD-10-CM | POA: Diagnosis not present

## 2020-04-22 DIAGNOSIS — J0100 Acute maxillary sinusitis, unspecified: Secondary | ICD-10-CM | POA: Diagnosis not present

## 2020-04-22 DIAGNOSIS — G894 Chronic pain syndrome: Secondary | ICD-10-CM | POA: Diagnosis not present

## 2020-04-28 DIAGNOSIS — E538 Deficiency of other specified B group vitamins: Secondary | ICD-10-CM | POA: Diagnosis not present

## 2020-05-21 DIAGNOSIS — E039 Hypothyroidism, unspecified: Secondary | ICD-10-CM | POA: Diagnosis not present

## 2020-05-21 DIAGNOSIS — E7800 Pure hypercholesterolemia, unspecified: Secondary | ICD-10-CM | POA: Diagnosis not present

## 2020-05-21 DIAGNOSIS — E1165 Type 2 diabetes mellitus with hyperglycemia: Secondary | ICD-10-CM | POA: Diagnosis not present

## 2020-05-28 DIAGNOSIS — L0231 Cutaneous abscess of buttock: Secondary | ICD-10-CM | POA: Diagnosis not present

## 2020-05-31 ENCOUNTER — Other Ambulatory Visit: Payer: Self-pay

## 2020-05-31 ENCOUNTER — Ambulatory Visit
Admission: RE | Admit: 2020-05-31 | Discharge: 2020-05-31 | Disposition: A | Discharge status: Home / Self Care | Payer: Medicare HMO | Source: Ambulatory Visit | Attending: Family Medicine | Admitting: Family Medicine

## 2020-05-31 DIAGNOSIS — E2839 Other primary ovarian failure: Secondary | ICD-10-CM

## 2020-05-31 DIAGNOSIS — D225 Melanocytic nevi of trunk: Secondary | ICD-10-CM | POA: Diagnosis not present

## 2020-05-31 DIAGNOSIS — L821 Other seborrheic keratosis: Secondary | ICD-10-CM | POA: Diagnosis not present

## 2020-05-31 DIAGNOSIS — D2272 Melanocytic nevi of left lower limb, including hip: Secondary | ICD-10-CM | POA: Diagnosis not present

## 2020-05-31 DIAGNOSIS — D1801 Hemangioma of skin and subcutaneous tissue: Secondary | ICD-10-CM | POA: Diagnosis not present

## 2020-05-31 DIAGNOSIS — D2239 Melanocytic nevi of other parts of face: Secondary | ICD-10-CM | POA: Diagnosis not present

## 2020-05-31 DIAGNOSIS — L738 Other specified follicular disorders: Secondary | ICD-10-CM | POA: Diagnosis not present

## 2020-05-31 DIAGNOSIS — Z780 Asymptomatic menopausal state: Secondary | ICD-10-CM | POA: Diagnosis not present

## 2020-06-01 DIAGNOSIS — E538 Deficiency of other specified B group vitamins: Secondary | ICD-10-CM | POA: Diagnosis not present

## 2020-06-17 DIAGNOSIS — E7800 Pure hypercholesterolemia, unspecified: Secondary | ICD-10-CM | POA: Diagnosis not present

## 2020-06-17 DIAGNOSIS — I10 Essential (primary) hypertension: Secondary | ICD-10-CM | POA: Diagnosis not present

## 2020-06-17 DIAGNOSIS — E1165 Type 2 diabetes mellitus with hyperglycemia: Secondary | ICD-10-CM | POA: Diagnosis not present

## 2020-06-17 DIAGNOSIS — E039 Hypothyroidism, unspecified: Secondary | ICD-10-CM | POA: Diagnosis not present

## 2020-07-06 DIAGNOSIS — E538 Deficiency of other specified B group vitamins: Secondary | ICD-10-CM | POA: Diagnosis not present

## 2020-08-04 DIAGNOSIS — E538 Deficiency of other specified B group vitamins: Secondary | ICD-10-CM | POA: Diagnosis not present

## 2020-08-12 DIAGNOSIS — E7800 Pure hypercholesterolemia, unspecified: Secondary | ICD-10-CM | POA: Diagnosis not present

## 2020-08-12 DIAGNOSIS — E1165 Type 2 diabetes mellitus with hyperglycemia: Secondary | ICD-10-CM | POA: Diagnosis not present

## 2020-08-12 DIAGNOSIS — I10 Essential (primary) hypertension: Secondary | ICD-10-CM | POA: Diagnosis not present

## 2020-08-16 ENCOUNTER — Other Ambulatory Visit: Payer: Self-pay | Admitting: Endocrinology

## 2020-08-16 DIAGNOSIS — E039 Hypothyroidism, unspecified: Secondary | ICD-10-CM

## 2020-08-19 DIAGNOSIS — Z20822 Contact with and (suspected) exposure to covid-19: Secondary | ICD-10-CM | POA: Diagnosis not present

## 2020-08-19 DIAGNOSIS — U071 COVID-19: Secondary | ICD-10-CM | POA: Diagnosis not present

## 2020-08-26 ENCOUNTER — Other Ambulatory Visit: Payer: Medicare HMO

## 2020-09-02 DIAGNOSIS — G894 Chronic pain syndrome: Secondary | ICD-10-CM | POA: Diagnosis not present

## 2020-09-02 DIAGNOSIS — E039 Hypothyroidism, unspecified: Secondary | ICD-10-CM | POA: Diagnosis not present

## 2020-09-02 DIAGNOSIS — M5030 Other cervical disc degeneration, unspecified cervical region: Secondary | ICD-10-CM | POA: Diagnosis not present

## 2020-09-02 DIAGNOSIS — I10 Essential (primary) hypertension: Secondary | ICD-10-CM | POA: Diagnosis not present

## 2020-09-02 DIAGNOSIS — E7800 Pure hypercholesterolemia, unspecified: Secondary | ICD-10-CM | POA: Diagnosis not present

## 2020-09-02 DIAGNOSIS — M5136 Other intervertebral disc degeneration, lumbar region: Secondary | ICD-10-CM | POA: Diagnosis not present

## 2020-09-02 DIAGNOSIS — E1169 Type 2 diabetes mellitus with other specified complication: Secondary | ICD-10-CM | POA: Diagnosis not present

## 2020-09-02 DIAGNOSIS — E538 Deficiency of other specified B group vitamins: Secondary | ICD-10-CM | POA: Diagnosis not present

## 2020-09-07 ENCOUNTER — Ambulatory Visit
Admission: RE | Admit: 2020-09-07 | Discharge: 2020-09-07 | Disposition: A | Discharge status: Home / Self Care | Payer: Medicare HMO | Source: Ambulatory Visit | Attending: Endocrinology | Admitting: Endocrinology

## 2020-09-07 DIAGNOSIS — E039 Hypothyroidism, unspecified: Secondary | ICD-10-CM

## 2020-09-07 DIAGNOSIS — E041 Nontoxic single thyroid nodule: Secondary | ICD-10-CM | POA: Diagnosis not present

## 2020-09-24 DIAGNOSIS — E119 Type 2 diabetes mellitus without complications: Secondary | ICD-10-CM | POA: Diagnosis not present

## 2020-09-30 DIAGNOSIS — E538 Deficiency of other specified B group vitamins: Secondary | ICD-10-CM | POA: Diagnosis not present

## 2020-10-29 DIAGNOSIS — N959 Unspecified menopausal and perimenopausal disorder: Secondary | ICD-10-CM | POA: Diagnosis not present

## 2020-10-29 DIAGNOSIS — Z01419 Encounter for gynecological examination (general) (routine) without abnormal findings: Secondary | ICD-10-CM | POA: Diagnosis not present

## 2020-10-29 DIAGNOSIS — Z01411 Encounter for gynecological examination (general) (routine) with abnormal findings: Secondary | ICD-10-CM | POA: Diagnosis not present

## 2020-10-29 DIAGNOSIS — Z124 Encounter for screening for malignant neoplasm of cervix: Secondary | ICD-10-CM | POA: Diagnosis not present

## 2020-10-29 DIAGNOSIS — Z6833 Body mass index (BMI) 33.0-33.9, adult: Secondary | ICD-10-CM | POA: Diagnosis not present

## 2020-10-29 DIAGNOSIS — Z1231 Encounter for screening mammogram for malignant neoplasm of breast: Secondary | ICD-10-CM | POA: Diagnosis not present

## 2020-11-01 DIAGNOSIS — E538 Deficiency of other specified B group vitamins: Secondary | ICD-10-CM | POA: Diagnosis not present

## 2020-11-30 DIAGNOSIS — E538 Deficiency of other specified B group vitamins: Secondary | ICD-10-CM | POA: Diagnosis not present

## 2020-12-08 DIAGNOSIS — R1319 Other dysphagia: Secondary | ICD-10-CM | POA: Diagnosis not present

## 2020-12-16 DIAGNOSIS — E1165 Type 2 diabetes mellitus with hyperglycemia: Secondary | ICD-10-CM | POA: Diagnosis not present

## 2020-12-16 DIAGNOSIS — E538 Deficiency of other specified B group vitamins: Secondary | ICD-10-CM | POA: Diagnosis not present

## 2020-12-16 DIAGNOSIS — E039 Hypothyroidism, unspecified: Secondary | ICD-10-CM | POA: Diagnosis not present

## 2020-12-16 DIAGNOSIS — E7800 Pure hypercholesterolemia, unspecified: Secondary | ICD-10-CM | POA: Diagnosis not present

## 2020-12-23 DIAGNOSIS — E7800 Pure hypercholesterolemia, unspecified: Secondary | ICD-10-CM | POA: Diagnosis not present

## 2020-12-23 DIAGNOSIS — E039 Hypothyroidism, unspecified: Secondary | ICD-10-CM | POA: Diagnosis not present

## 2020-12-23 DIAGNOSIS — E1165 Type 2 diabetes mellitus with hyperglycemia: Secondary | ICD-10-CM | POA: Diagnosis not present

## 2020-12-23 DIAGNOSIS — I10 Essential (primary) hypertension: Secondary | ICD-10-CM | POA: Diagnosis not present

## 2021-01-03 DIAGNOSIS — E538 Deficiency of other specified B group vitamins: Secondary | ICD-10-CM | POA: Diagnosis not present

## 2021-02-02 DIAGNOSIS — E538 Deficiency of other specified B group vitamins: Secondary | ICD-10-CM | POA: Diagnosis not present

## 2021-03-07 DIAGNOSIS — E538 Deficiency of other specified B group vitamins: Secondary | ICD-10-CM | POA: Diagnosis not present

## 2021-03-17 DIAGNOSIS — E611 Iron deficiency: Secondary | ICD-10-CM | POA: Diagnosis not present

## 2021-03-17 DIAGNOSIS — G894 Chronic pain syndrome: Secondary | ICD-10-CM | POA: Diagnosis not present

## 2021-03-17 DIAGNOSIS — R61 Generalized hyperhidrosis: Secondary | ICD-10-CM | POA: Diagnosis not present

## 2021-03-18 ENCOUNTER — Other Ambulatory Visit: Payer: Self-pay

## 2021-03-18 ENCOUNTER — Other Ambulatory Visit: Payer: Self-pay | Admitting: Family Medicine

## 2021-03-18 ENCOUNTER — Ambulatory Visit
Admission: RE | Admit: 2021-03-18 | Discharge: 2021-03-18 | Disposition: A | Discharge status: Home / Self Care | Payer: Medicare HMO | Source: Ambulatory Visit | Attending: Family Medicine | Admitting: Family Medicine

## 2021-03-18 DIAGNOSIS — R61 Generalized hyperhidrosis: Secondary | ICD-10-CM

## 2021-03-18 DIAGNOSIS — R0602 Shortness of breath: Secondary | ICD-10-CM | POA: Diagnosis not present

## 2021-03-21 ENCOUNTER — Emergency Department (HOSPITAL_COMMUNITY): Payer: Medicare HMO

## 2021-03-21 ENCOUNTER — Encounter (HOSPITAL_COMMUNITY): Payer: Self-pay | Admitting: Emergency Medicine

## 2021-03-21 ENCOUNTER — Other Ambulatory Visit: Payer: Self-pay

## 2021-03-21 ENCOUNTER — Inpatient Hospital Stay (HOSPITAL_COMMUNITY)
Admission: EM | Admit: 2021-03-21 | Discharge: 2021-03-23 | DRG: 343 | Disposition: A | Discharge status: Home / Self Care | Payer: Medicare HMO | Attending: General Surgery | Admitting: General Surgery

## 2021-03-21 DIAGNOSIS — R109 Unspecified abdominal pain: Secondary | ICD-10-CM | POA: Diagnosis present

## 2021-03-21 DIAGNOSIS — M1990 Unspecified osteoarthritis, unspecified site: Secondary | ICD-10-CM | POA: Diagnosis present

## 2021-03-21 DIAGNOSIS — Z794 Long term (current) use of insulin: Secondary | ICD-10-CM | POA: Diagnosis not present

## 2021-03-21 DIAGNOSIS — Z90710 Acquired absence of both cervix and uterus: Secondary | ICD-10-CM | POA: Diagnosis not present

## 2021-03-21 DIAGNOSIS — Z8249 Family history of ischemic heart disease and other diseases of the circulatory system: Secondary | ICD-10-CM

## 2021-03-21 DIAGNOSIS — K3580 Unspecified acute appendicitis: Secondary | ICD-10-CM | POA: Diagnosis not present

## 2021-03-21 DIAGNOSIS — Z79890 Hormone replacement therapy (postmenopausal): Secondary | ICD-10-CM | POA: Diagnosis not present

## 2021-03-21 DIAGNOSIS — R1031 Right lower quadrant pain: Principal | ICD-10-CM | POA: Diagnosis present

## 2021-03-21 DIAGNOSIS — R000 Tachycardia, unspecified: Secondary | ICD-10-CM | POA: Diagnosis not present

## 2021-03-21 DIAGNOSIS — R0789 Other chest pain: Secondary | ICD-10-CM | POA: Diagnosis not present

## 2021-03-21 DIAGNOSIS — Z888 Allergy status to other drugs, medicaments and biological substances status: Secondary | ICD-10-CM

## 2021-03-21 DIAGNOSIS — Z9049 Acquired absence of other specified parts of digestive tract: Secondary | ICD-10-CM | POA: Diagnosis not present

## 2021-03-21 DIAGNOSIS — Z803 Family history of malignant neoplasm of breast: Secondary | ICD-10-CM

## 2021-03-21 DIAGNOSIS — Z808 Family history of malignant neoplasm of other organs or systems: Secondary | ICD-10-CM

## 2021-03-21 DIAGNOSIS — K388 Other specified diseases of appendix: Secondary | ICD-10-CM | POA: Diagnosis not present

## 2021-03-21 DIAGNOSIS — R1013 Epigastric pain: Secondary | ICD-10-CM | POA: Diagnosis not present

## 2021-03-21 DIAGNOSIS — Z7984 Long term (current) use of oral hypoglycemic drugs: Secondary | ICD-10-CM

## 2021-03-21 DIAGNOSIS — Z8349 Family history of other endocrine, nutritional and metabolic diseases: Secondary | ICD-10-CM | POA: Diagnosis not present

## 2021-03-21 DIAGNOSIS — Z832 Family history of diseases of the blood and blood-forming organs and certain disorders involving the immune mechanism: Secondary | ICD-10-CM

## 2021-03-21 DIAGNOSIS — E1140 Type 2 diabetes mellitus with diabetic neuropathy, unspecified: Secondary | ICD-10-CM | POA: Diagnosis present

## 2021-03-21 DIAGNOSIS — R079 Chest pain, unspecified: Secondary | ICD-10-CM | POA: Diagnosis not present

## 2021-03-21 DIAGNOSIS — Z886 Allergy status to analgesic agent status: Secondary | ICD-10-CM

## 2021-03-21 DIAGNOSIS — Z79899 Other long term (current) drug therapy: Secondary | ICD-10-CM | POA: Diagnosis not present

## 2021-03-21 DIAGNOSIS — Z791 Long term (current) use of non-steroidal anti-inflammatories (NSAID): Secondary | ICD-10-CM

## 2021-03-21 DIAGNOSIS — K37 Unspecified appendicitis: Secondary | ICD-10-CM | POA: Diagnosis not present

## 2021-03-21 DIAGNOSIS — Z833 Family history of diabetes mellitus: Secondary | ICD-10-CM

## 2021-03-21 DIAGNOSIS — Z8041 Family history of malignant neoplasm of ovary: Secondary | ICD-10-CM | POA: Diagnosis not present

## 2021-03-21 DIAGNOSIS — Z86010 Personal history of colonic polyps: Secondary | ICD-10-CM

## 2021-03-21 DIAGNOSIS — R42 Dizziness and giddiness: Secondary | ICD-10-CM | POA: Diagnosis not present

## 2021-03-21 DIAGNOSIS — R1084 Generalized abdominal pain: Secondary | ICD-10-CM | POA: Diagnosis not present

## 2021-03-21 DIAGNOSIS — Z823 Family history of stroke: Secondary | ICD-10-CM | POA: Diagnosis not present

## 2021-03-21 DIAGNOSIS — Z20822 Contact with and (suspected) exposure to covid-19: Secondary | ICD-10-CM | POA: Diagnosis present

## 2021-03-21 DIAGNOSIS — R0902 Hypoxemia: Secondary | ICD-10-CM | POA: Diagnosis not present

## 2021-03-21 DIAGNOSIS — E079 Disorder of thyroid, unspecified: Secondary | ICD-10-CM | POA: Diagnosis present

## 2021-03-21 HISTORY — DX: Nausea with vomiting, unspecified: R11.2

## 2021-03-21 HISTORY — DX: Diverticulosis of intestine, part unspecified, without perforation or abscess without bleeding: K57.90

## 2021-03-21 HISTORY — DX: Other specified postprocedural states: Z98.890

## 2021-03-21 LAB — CBC WITH DIFFERENTIAL/PLATELET
Abs Immature Granulocytes: 0.05 10*3/uL (ref 0.00–0.07)
Basophils Absolute: 0.1 10*3/uL (ref 0.0–0.1)
Basophils Relative: 1 %
Eosinophils Absolute: 0.5 10*3/uL (ref 0.0–0.5)
Eosinophils Relative: 3 %
HCT: 35.9 % — ABNORMAL LOW (ref 36.0–46.0)
Hemoglobin: 10.6 g/dL — ABNORMAL LOW (ref 12.0–15.0)
Immature Granulocytes: 0 %
Lymphocytes Relative: 15 %
Lymphs Abs: 2.2 10*3/uL (ref 0.7–4.0)
MCH: 23.9 pg — ABNORMAL LOW (ref 26.0–34.0)
MCHC: 29.5 g/dL — ABNORMAL LOW (ref 30.0–36.0)
MCV: 81 fL (ref 80.0–100.0)
Monocytes Absolute: 0.8 10*3/uL (ref 0.1–1.0)
Monocytes Relative: 5 %
Neutro Abs: 11.2 10*3/uL — ABNORMAL HIGH (ref 1.7–7.7)
Neutrophils Relative %: 76 %
Platelets: 302 10*3/uL (ref 150–400)
RBC: 4.43 MIL/uL (ref 3.87–5.11)
RDW: 16 % — ABNORMAL HIGH (ref 11.5–15.5)
WBC: 14.7 10*3/uL — ABNORMAL HIGH (ref 4.0–10.5)
nRBC: 0 % (ref 0.0–0.2)

## 2021-03-21 LAB — RESP PANEL BY RT-PCR (FLU A&B, COVID) ARPGX2
Influenza A by PCR: NEGATIVE
Influenza B by PCR: NEGATIVE
SARS Coronavirus 2 by RT PCR: NEGATIVE

## 2021-03-21 LAB — COMPREHENSIVE METABOLIC PANEL
ALT: 18 U/L (ref 0–44)
AST: 23 U/L (ref 15–41)
Albumin: 4 g/dL (ref 3.5–5.0)
Alkaline Phosphatase: 42 U/L (ref 38–126)
Anion gap: 13 (ref 5–15)
BUN: 12 mg/dL (ref 8–23)
CO2: 23 mmol/L (ref 22–32)
Calcium: 9.2 mg/dL (ref 8.9–10.3)
Chloride: 103 mmol/L (ref 98–111)
Creatinine, Ser: 0.99 mg/dL (ref 0.44–1.00)
GFR, Estimated: >60 mL/min (ref >60)
Glucose, Bld: 122 mg/dL — ABNORMAL HIGH (ref 70–99)
Potassium: 4.4 mmol/L (ref 3.5–5.1)
Sodium: 139 mmol/L (ref 135–145)
Total Bilirubin: 0.3 mg/dL (ref 0.3–1.2)
Total Protein: 6.2 g/dL — ABNORMAL LOW (ref 6.5–8.1)

## 2021-03-21 LAB — TROPONIN I (HIGH SENSITIVITY)
Troponin I (High Sensitivity): 8 ng/L (ref <18)
Troponin I (High Sensitivity): 9 ng/L (ref <18)

## 2021-03-21 LAB — CBG MONITORING, ED: Glucose-Capillary: 111 mg/dL — ABNORMAL HIGH (ref 70–99)

## 2021-03-21 LAB — LIPASE, BLOOD: Lipase: 29 U/L (ref 11–51)

## 2021-03-21 MED ORDER — LACTATED RINGERS IV BOLUS
1000.0000 mL | Freq: Once | INTRAVENOUS | Status: AC
  Administered 2021-03-21: 1000 mL via INTRAVENOUS

## 2021-03-21 MED ORDER — ACETAMINOPHEN 650 MG RE SUPP: 650.0000 mg | Freq: Four times a day (QID) | RECTAL | Status: DC | PRN

## 2021-03-21 MED ORDER — ACETAMINOPHEN 325 MG PO TABS
650.0000 mg | ORAL_TABLET | Freq: Four times a day (QID) | ORAL | Status: DC | PRN
  Administered 2021-03-21: 650 mg via ORAL
  Filled 2021-03-21: qty 2

## 2021-03-21 MED ORDER — SALINE SPRAY 0.65 % NA SOLN: 1.0000 | NASAL | Status: DC | PRN

## 2021-03-21 MED ORDER — ESTRADIOL 1 MG PO TABS
1.0000 mg | ORAL_TABLET | Freq: Every day | ORAL | Status: DC
  Administered 2021-03-22: 1 mg via ORAL
  Filled 2021-03-21 (×2): qty 1

## 2021-03-21 MED ORDER — DICYCLOMINE HCL 10 MG PO CAPS
10.0000 mg | ORAL_CAPSULE | Freq: Three times a day (TID) | ORAL | Status: DC
  Administered 2021-03-21 – 2021-03-23 (×4): 10 mg via ORAL
  Filled 2021-03-21 (×4): qty 1

## 2021-03-21 MED ORDER — FUROSEMIDE 20 MG PO TABS
20.0000 mg | ORAL_TABLET | Freq: Every day | ORAL | Status: DC
  Administered 2021-03-21 – 2021-03-23 (×3): 20 mg via ORAL
  Filled 2021-03-21 (×3): qty 1

## 2021-03-21 MED ORDER — POLYVINYL ALCOHOL 1.4 % OP SOLN
1.0000 [drp] | OPHTHALMIC | Status: DC | PRN
  Filled 2021-03-21: qty 15

## 2021-03-21 MED ORDER — POLYVINYL ALCOHOL 1.4 % OP SOLN
1.0000 [drp] | Freq: Every day | OPHTHALMIC | Status: DC
  Filled 2021-03-21: qty 15

## 2021-03-21 MED ORDER — ALUM & MAG HYDROXIDE-SIMETH 200-200-20 MG/5ML PO SUSP
30.0000 mL | Freq: Once | ORAL | Status: AC
  Administered 2021-03-21: 30 mL via ORAL
  Filled 2021-03-21: qty 30

## 2021-03-21 MED ORDER — LEVOTHYROXINE SODIUM 25 MCG PO TABS: 25.0000 ug | ORAL_TABLET | Freq: Every day | ORAL | Status: DC

## 2021-03-21 MED ORDER — LISINOPRIL 5 MG PO TABS
5.0000 mg | ORAL_TABLET | Freq: Every day | ORAL | Status: DC
  Administered 2021-03-21 – 2021-03-23 (×3): 5 mg via ORAL
  Filled 2021-03-21 (×3): qty 1

## 2021-03-21 MED ORDER — PANTOPRAZOLE SODIUM 40 MG IV SOLR
40.0000 mg | Freq: Every day | INTRAVENOUS | Status: DC
  Administered 2021-03-21 – 2021-03-22 (×2): 40 mg via INTRAVENOUS
  Filled 2021-03-21 (×2): qty 40

## 2021-03-21 MED ORDER — ONDANSETRON 4 MG PO TBDP: 4.0000 mg | ORAL_TABLET | Freq: Four times a day (QID) | ORAL | Status: DC | PRN

## 2021-03-21 MED ORDER — HYDRALAZINE HCL 10 MG PO TABS: 10.0000 mg | ORAL_TABLET | Freq: Four times a day (QID) | ORAL | Status: DC | PRN

## 2021-03-21 MED ORDER — SODIUM CHLORIDE 0.9 % IV SOLN
2.0000 g | INTRAVENOUS | Status: DC
  Administered 2021-03-21 – 2021-03-22 (×2): 2 g via INTRAVENOUS
  Filled 2021-03-21 (×3): qty 20

## 2021-03-21 MED ORDER — PAROXETINE HCL ER 12.5 MG PO TB24
25.0000 mg | ORAL_TABLET | Freq: Every day | ORAL | Status: DC
  Administered 2021-03-23: 25 mg via ORAL
  Filled 2021-03-21: qty 1
  Filled 2021-03-21: qty 2
  Filled 2021-03-21: qty 1

## 2021-03-21 MED ORDER — INSULIN ASPART 100 UNIT/ML IJ SOLN
0.0000 [IU] | INTRAMUSCULAR | Status: DC
  Administered 2021-03-22 (×2): 2 [IU] via SUBCUTANEOUS
  Administered 2021-03-22: 5 [IU] via SUBCUTANEOUS
  Administered 2021-03-22: 2 [IU] via SUBCUTANEOUS

## 2021-03-21 MED ORDER — LIDOCAINE VISCOUS HCL 2 % MT SOLN
15.0000 mL | Freq: Once | OROMUCOSAL | Status: AC
  Administered 2021-03-21: 15 mL via ORAL
  Filled 2021-03-21: qty 15

## 2021-03-21 MED ORDER — IOHEXOL 350 MG/ML SOLN
95.0000 mL | Freq: Once | INTRAVENOUS | Status: AC | PRN
  Administered 2021-03-21: 95 mL via INTRAVENOUS

## 2021-03-21 MED ORDER — POLYETHYL GLYCOL-PROPYL GLYCOL 0.4-0.3 % OP SOLN: 2.0000 [drp] | Freq: Every day | OPHTHALMIC | Status: DC

## 2021-03-21 MED ORDER — ONDANSETRON HCL 4 MG/2ML IJ SOLN
4.0000 mg | Freq: Once | INTRAMUSCULAR | Status: AC
  Administered 2021-03-21: 4 mg via INTRAVENOUS
  Filled 2021-03-21: qty 2

## 2021-03-21 MED ORDER — METRONIDAZOLE 500 MG/100ML IV SOLN
500.0000 mg | Freq: Two times a day (BID) | INTRAVENOUS | Status: DC
Start: 2021-03-21 — End: 2021-03-23
  Administered 2021-03-21 – 2021-03-23 (×4): 500 mg via INTRAVENOUS
  Filled 2021-03-21 (×4): qty 100

## 2021-03-21 MED ORDER — MORPHINE SULFATE (PF) 4 MG/ML IV SOLN
4.0000 mg | INTRAVENOUS | Status: DC | PRN
Start: 2021-03-21 — End: 2021-03-23
  Administered 2021-03-22 (×2): 4 mg via INTRAVENOUS
  Filled 2021-03-21 (×2): qty 1

## 2021-03-21 MED ORDER — DIPHENHYDRAMINE HCL 50 MG/ML IJ SOLN: 12.5000 mg | Freq: Four times a day (QID) | INTRAMUSCULAR | Status: DC | PRN

## 2021-03-21 MED ORDER — LEVOTHYROXINE SODIUM 25 MCG PO TABS
25.0000 ug | ORAL_TABLET | Freq: Every day | ORAL | Status: DC
  Administered 2021-03-22 – 2021-03-23 (×2): 25 ug via ORAL
  Filled 2021-03-21 (×3): qty 1

## 2021-03-21 MED ORDER — OXYCODONE HCL 5 MG PO TABS
5.0000 mg | ORAL_TABLET | ORAL | Status: DC | PRN
  Administered 2021-03-22 – 2021-03-23 (×3): 5 mg via ORAL
  Administered 2021-03-23: 10 mg via ORAL
  Filled 2021-03-21: qty 1
  Filled 2021-03-21: qty 2
  Filled 2021-03-21: qty 1
  Filled 2021-03-21: qty 2

## 2021-03-21 MED ORDER — ONDANSETRON HCL 4 MG/2ML IJ SOLN: 4.0000 mg | Freq: Four times a day (QID) | INTRAMUSCULAR | Status: DC | PRN

## 2021-03-21 MED ORDER — DIPHENHYDRAMINE HCL 12.5 MG/5ML PO ELIX: 12.5000 mg | ORAL_SOLUTION | Freq: Four times a day (QID) | ORAL | Status: DC | PRN

## 2021-03-21 NOTE — ED Provider Notes (Signed)
Patient care signed out to follow-up second troponin and CT abdomen pelvis.  Patient presented productive cough along with chest discomfort for a few weeks.  Patient does feel improved on my assessment.  Primarily having diffuse abdominal cramping and pain.  CT scan results pending.  CT scan results reviewed consistent with appendicitis, discussed with general surgeon for consult.       Elnora Morrison, MD 03/21/21 2312

## 2021-03-21 NOTE — ED Provider Notes (Signed)
[facility] EMERGENCY DEPARTMENT Provider Note   CSN: [id number] Arrival date & time: 03/21/21  1335     History Chief Complaint  Patient presents with   Chest Pain    Pamela Murray is a 70 y.o. female.  Patient is a 70 year old female with a history of thyroid disease, diabetes, chronic stomach problems and 2-year history of intermittent palpitations and dizziness who is presenting today with several complaints.  Patient reports that for the last 2 weeks she has had worsening abdominal pain, intermittent nausea that always seems to be worse with eating.  She also has noted in the last 3 to 4 days that she has had a worsening productive cough with yellow sputum.  Since 4 AM this morning she has been feeling like her heart was racing, near syncope, having the sweats but today she felt like the symptoms were much worse and she was feeling more short of breath than usual.  She has had some chills but denies any fever.  She denies any weight loss.  She always has diarrhea but has not noticed any blood in her stool.  Blood sugars have been stable.  No recent medication changes.  Because of these ongoing issues she has had work-up with her GP and in the past wore a monitor with cardiology but because she reacted to the stickers and it caused her skin to break out she only wore the monitor for 36 hours and it did not show any acute events.  Today because she was feeling worse she called 911.  EMS reports that when they arrived today patient was diaphoretic, tachycardic in the 130s and complaining of chest pain.  She describes it more as chest and abdominal pain that goes all the way down her abdomen almost into her pelvis.  She is not having specific urinary complaints at this time.  She did see her doctor on Friday who did a chest x-ray but she never received the results.  She has not been on any antibiotics recently.  She denies any prior history of heart issues.  3 months ago she  had her thyroid checked and reports the levels are within normal limits and the cyst on her thyroid had not changed in size.  EMS gave patient 250 mL of fluid and 1 nitroglycerin which she reports did improve her symptoms.  The history is provided by the patient and medical records.  Chest Pain     Past Medical History:  Diagnosis Date   Diabetes mellitus without complication ([place])    Diverticulosis    Neuropathy    Osteoarthritis    Thyroid disease     Patient Active Problem List   Diagnosis Date Noted   Palpitation 05/30/2018   Pain in both lower extremities 05/30/2018    Past Surgical History:  Procedure Laterality Date   ABDOMINAL HYSTERECTOMY     CHOLECYSTECTOMY     HEMORRHOID SURGERY       OB History   No obstetric history on file.     Family History  Problem Relation Age of Onset   Thyroid disease Mother    Ovarian cancer Mother    Diabetes Mother    Stroke Mother        Died age 69   Heart failure Mother    Diabetes Father    Heart disease Father        Died age 82   Breast cancer Sister    Breast cancer Sister  Atrial fibrillation Sister    Atrial fibrillation Sister    Clotting disorder Sister     Social History   Tobacco Use   Smoking status: Never   Smokeless tobacco: Never  Substance Use Topics   Alcohol use: No    Home Medications Prior to Admission medications   Medication Sig Start Date End Date Taking? Authorizing Provider  cholecalciferol (VITAMIN D) 1000 units tablet Take 1,000 Units by mouth daily.    [provider]  cyanocobalamin (,VITAMIN B-12,) 1000 MCG/ML injection Inject 1,000 mcg into the muscle every 30 (thirty) days. 07/13/16   [provider]  dicyclomine (BENTYL) 10 MG capsule Take 10 mg by mouth 3 (three) times daily.     [provider]  estradiol (ESTRACE) 1 MG tablet Take 1 mg by mouth daily.    [provider]  fexofenadine (KP FEXOFENADINE HCL) 180 MG tablet Take 180 mg by  mouth daily.    [provider]  furosemide (LASIX) 20 MG tablet Take 20 mg by mouth.    [provider]  HYDROcodone-acetaminophen (NORCO) 7.5-325 MG tablet Take 1 tablet by mouth 2 (two) times daily.     [provider]  Insulin Isophane & Regular Human (NOVOLIN 70/30 FLEXPEN) (70-30) 100 UNIT/ML PEN Inject into the skin. Use as directed    [provider]  levothyroxine (SYNTHROID, LEVOTHROID) 25 MCG tablet Take 25 mcg by mouth daily before breakfast.    [provider]  lisinopril (PRINIVIL,ZESTRIL) 5 MG tablet Take 5 mg by mouth daily.    [provider]  meloxicam (MOBIC) 7.5 MG tablet Take 7.5 mg by mouth daily.    [provider]  metFORMIN (GLUMETZA) 1000 MG (MOD) 24 hr tablet Take 1,000 mg by mouth 2 (two) times daily with a meal.     [provider]  PARoxetine (PAXIL-CR) 25 MG 24 hr tablet Take 25 mg by mouth at bedtime.     [provider]  RABEprazole (ACIPHEX) 20 MG tablet Take 20 mg by mouth daily.    [provider]  sitaGLIPtin (JANUVIA) 100 MG tablet Take 100 mg by mouth daily.    [provider]  sodium chloride (OCEAN) 0.65 % SOLN nasal spray Place 1 spray into both nostrils as needed for congestion.    [provider]    Allergies    Statins  Review of Systems   Review of Systems  Cardiovascular:  Positive for chest pain.  All other systems reviewed and are negative.  Physical Exam Updated Vital Signs BP (!) 149/110   Pulse (!) 105   Temp 99.2 F (37.3 C) (Oral)   Resp 18   Ht '5\' 5"'$  (1.651 m)   Wt 92.1 kg   SpO2 97%   BMI 33.78 kg/m   Physical Exam Vitals and nursing note reviewed.  Constitutional:      General: She is not in acute distress.    Appearance: She is well-developed. She is diaphoretic.  HENT:     Head: Normocephalic and atraumatic.     Mouth/Throat:     Mouth: Mucous membranes are dry.  Eyes:     Pupils: Pupils are equal, round,  and reactive to light.  Cardiovascular:     Rate and Rhythm: Regular rhythm. Tachycardia present.     Pulses: Normal pulses.     Heart sounds: Normal heart sounds. No murmur heard.   No friction rub.  Pulmonary:     Effort: Pulmonary effort is normal.  Breath sounds: Normal breath sounds. No wheezing or rales.  Abdominal:     General: Bowel sounds are normal. There is no distension.     Palpations: Abdomen is soft.     Tenderness: There is abdominal tenderness in the epigastric area and periumbilical area. There is no guarding or rebound.  Musculoskeletal:        General: No tenderness. Normal range of motion.     Cervical back: Normal range of motion and neck supple.     Right lower leg: No edema.     Left lower leg: No edema.     Comments: No edema  Skin:    General: Skin is warm.     Coloration: Skin is not pale.     Findings: No rash.  Neurological:     Mental Status: She is alert and oriented to person, place, and time. Mental status is at baseline.     Cranial Nerves: No cranial nerve deficit.  Psychiatric:        Mood and Affect: Mood normal.        Behavior: Behavior normal.    ED Results / Procedures / Treatments   Labs (all labs ordered are listed, but only abnormal results are displayed) Labs Reviewed  CBC WITH DIFFERENTIAL/PLATELET  COMPREHENSIVE METABOLIC PANEL  LIPASE, BLOOD  TROPONIN I (HIGH SENSITIVITY)    EKG None  Radiology No results found.  Procedures Procedures   Medications Ordered in ED Medications  ondansetron (ZOFRAN) injection 4 mg (has no administration in time range)  alum & mag hydroxide-simeth (MAALOX/MYLANTA) 200-200-20 MG/5ML suspension 30 mL (has no administration in time range)    And  lidocaine (XYLOCAINE) 2 % viscous mouth solution 15 mL (has no administration in time range)    ED Course  I have reviewed the triage vital signs and the nursing notes.  Pertinent labs & imaging results that were available during my care  of the patient were reviewed by me and considered in my medical decision making (see chart for details).    MDM Rules/Calculators/A&P                           70 year old female presenting today with nonspecific chest and abdominal complaints.  Patient's symptoms have been persistent for 2 weeks and intermittent.  She is now having a productive cough, diaphoresis, shortness of breath, chest pain but also abdominal pain and nausea.  She has a long history of GI issues, prior diverticulitis, every 5 year colonoscopies/endoscopies.  No cardiac history but 2 years of complaints of palpitations, dizziness with standing.  Only wore a monitor for 36 hours due to reaction to the stickers and did not have further evaluation.  Patient does have cardiac risk factors but also concern for infectious etiology versus GI causes.  There was some improvement with IV fluids and nitroglycerin.  She is still complaining of epigastric discomfort currently.  We will give a GI cocktail.  Also will give IV fluids and antiemetics.  Labs and imaging are pending.  EKG without acute findings.  Will rule out atypical ACS.  Low suspicion for PE at this time, dissection, pericarditis or myocarditis. Final Clinical Impression(s) / ED Diagnoses Final diagnoses:  None    Rx / DC Orders ED Discharge Orders     None        Blanchie Dessert, MD 03/22/21 [phone]

## 2021-03-21 NOTE — ED Triage Notes (Signed)
Pt BIB [facility] from home. P[t was sitting and started having CP. Pt has had a productive cough x1 week, with CP x2 weeks, but this feels different. Pt c/o acute substernal CP that started at 1200, 7/10 nonradiating. Pt had an episode of diaphoresis that persisted until EMS arrival. Pt pale on arrival, hx anemia. HR 130-140 at rest. Given 28m NS and 1 SL NTG. Refused ASA. 12 lead unremarkable besides sinus tach.

## 2021-03-21 NOTE — ED Notes (Signed)
Help get patient undressed on the monitor patient is resting call bell in reach

## 2021-03-21 NOTE — H&P (Signed)
Pamela Murray is an 70 y.o. female.   Chief Complaint: chest pain, abdominal pain and bloating HPI: 70yo F initially brought to the emergency department by EMS for chest pain.  She received nitroglycerin in route with improvement in symptoms.  She underwent a cardiac work-up including EKG and troponins which was unremarkable.  She then related to the emergency department staff that she has been having some abdominal pain.  CT scan of the abdomen pelvis was performed which showed possible mild appendicitis.  The findings were quite subtle.  She reports having generalized abdominal pain for about 4 weeks.  No constipation.  The pain is more in her lower abdomen than upper abdomen but is on both sides.  She describes getting bloating after she eats.  She has bowel movements which are very loose daily.  She reports this is her normal habit.  She notes that her appetite has been down some but she makes sure to eat due to her diabetes.  Past Medical History:  Diagnosis Date   Diabetes mellitus without complication ([place])    Diverticulosis    Neuropathy    Osteoarthritis    Thyroid disease     Past Surgical History:  Procedure Laterality Date   ABDOMINAL HYSTERECTOMY     CHOLECYSTECTOMY     HEMORRHOID SURGERY      Family History  Problem Relation Age of Onset   Thyroid disease Mother    Ovarian cancer Mother    Diabetes Mother    Stroke Mother        Died age 72   Heart failure Mother    Diabetes Father    Heart disease Father        Died age 80   Breast cancer Sister    Breast cancer Sister    Atrial fibrillation Sister    Atrial fibrillation Sister    Clotting disorder Sister    Social History:  reports that she has never smoked. She has never used smokeless tobacco. She reports that she does not drink alcohol. No history on file for drug use.  Allergies:  Allergies  Allergen Reactions   Aspirin Nausea Only   Statins Other (See Comments)    Joint pain    (Not in a  hospital admission)   Results for orders placed or performed during the hospital encounter of 03/21/21 (from the past 48 hour(s))  CBC with Differential/Platelet     Status: Abnormal   Collection Time: 03/21/21  1:43 PM  Result Value Ref Range   WBC 14.7 (H) 4.0 - 10.5 K/uL   RBC 4.43 3.87 - 5.11 MIL/uL   Hemoglobin 10.6 (L) 12.0 - 15.0 g/dL   HCT 35.9 (L) 36.0 - 46.0 %   MCV 81.0 80.0 - 100.0 fL   MCH 23.9 (L) 26.0 - 34.0 pg   MCHC 29.5 (L) 30.0 - 36.0 g/dL   RDW 16.0 (H) 11.5 - 15.5 %   Platelets 302 150 - 400 K/uL   nRBC 0.0 0.0 - 0.2 %   Neutrophils Relative % 76 %   Neutro Abs 11.2 (H) 1.7 - 7.7 K/uL   Lymphocytes Relative 15 %   Lymphs Abs 2.2 0.7 - 4.0 K/uL   Monocytes Relative 5 %   Monocytes Absolute 0.8 0.1 - 1.0 K/uL   Eosinophils Relative 3 %   Eosinophils Absolute 0.5 0.0 - 0.5 K/uL   Basophils Relative 1 %   Basophils Absolute 0.1 0.0 - 0.1 K/uL   Immature Granulocytes 0 %  Abs Immature Granulocytes 0.05 0.00 - 0.07 K/uL    Comment: Performed at [facility], [place] [address] [place], [place] [address]  Comprehensive metabolic panel     Status: Abnormal   Collection Time: 03/21/21  1:43 PM  Result Value Ref Range   Sodium 139 135 - 145 mmol/L   Potassium 4.4 3.5 - 5.1 mmol/L   Chloride 103 98 - 111 mmol/L   CO2 23 22 - 32 mmol/L   Glucose, Bld 122 (H) 70 - 99 mg/dL    Comment: Glucose reference range applies only to samples taken after fasting for at least 8 hours.   BUN 12 8 - 23 mg/dL   Creatinine, Ser 0.99 0.44 - 1.00 mg/dL   Calcium 9.2 8.9 - 10.3 mg/dL   Total Protein 6.2 (L) 6.5 - 8.1 g/dL   Albumin 4.0 3.5 - 5.0 g/dL   AST 23 15 - 41 U/L   ALT 18 0 - 44 U/L   Alkaline Phosphatase 42 38 - 126 U/L   Total Bilirubin 0.3 0.3 - 1.2 mg/dL   GFR, Estimated >60 >60 mL/min    Comment: (NOTE) Calculated using the CKD-EPI Creatinine Equation (2021)    Anion gap 13 5 - 15    Comment: Performed at [facility] [address], [place],  [place] [address]  Lipase, blood     Status: None   Collection Time: 03/21/21  1:43 PM  Result Value Ref Range   Lipase 29 11 - 51 U/L    Comment: Performed at [facility] [address], [place], [place] [address]  Troponin I (High Sensitivity)     Status: None   Collection Time: 03/21/21  1:43 PM  Result Value Ref Range   Troponin I (High Sensitivity) 8 <18 ng/L    Comment: (NOTE) Elevated high sensitivity troponin I (hsTnI) values and significant  changes across serial measurements may suggest ACS but many other  chronic and acute conditions are known to elevate hsTnI results.  Refer to the "Links" section for chest pain algorithms and additional  guidance. Performed at [facility], [place] [address] [place],  [address]   Resp Panel by RT-PCR (Flu A&B, Covid) Nasopharyngeal Swab     Status: None   Collection Time: 03/21/21  2:05 PM   Specimen: Nasopharyngeal Swab; Nasopharyngeal(NP) swabs in vial transport medium  Result Value Ref Range   SARS Coronavirus 2 by RT PCR NEGATIVE NEGATIVE    Comment: (NOTE) SARS-CoV-2 target nucleic acids are NOT DETECTED.  The SARS-CoV-2 RNA is generally detectable in upper respiratory specimens during the acute phase of infection. The lowest concentration of SARS-CoV-2 viral copies this assay can detect is 138 copies/mL. A negative result does not preclude SARS-Cov-2 infection and should not be used as the sole basis for treatment or other patient management decisions. A negative result may occur with  improper specimen collection/handling, submission of specimen other than nasopharyngeal swab, presence of viral mutation(s) within the areas targeted by this assay, and inadequate number of viral copies(<138 copies/mL). A negative result must be combined with clinical observations, patient history, and epidemiological information. The expected result is Negative.  Fact Sheet for Patients:   EntrepreneurPulse.com.au  Fact Sheet for Healthcare Providers:  IncredibleEmployment.be  This test is no t yet approved or cleared by the [place] FDA and  has been authorized for detection and/or diagnosis of SARS-CoV-2 by FDA under an Emergency Use Authorization (EUA). This EUA will  remain  in effect (meaning this test can be used) for the duration of the COVID-19 declaration under Section 564(b)(1) of the Act, 21 U.S.C.section 360bbb-3(b)(1), unless the authorization is terminated  or revoked sooner.       Influenza A by PCR NEGATIVE NEGATIVE   Influenza B by PCR NEGATIVE NEGATIVE    Comment: (NOTE) The Xpert Xpress SARS-CoV-2/FLU/RSV plus assay is intended as an aid in the diagnosis of influenza from Nasopharyngeal swab specimens and should not be used as a sole basis for treatment. Nasal washings and aspirates are unacceptable for Xpert Xpress SARS-CoV-2/FLU/RSV testing.  Fact Sheet for Patients: EntrepreneurPulse.com.au  Fact Sheet for Healthcare Providers: IncredibleEmployment.be  This test is not yet approved or cleared by the [place] FDA and has been authorized for detection and/or diagnosis of SARS-CoV-2 by FDA under an Emergency Use Authorization (EUA). This EUA will remain in effect (meaning this test can be used) for the duration of the COVID-19 declaration under Section 564(b)(1) of the Act, 21 U.S.C. section 360bbb-3(b)(1), unless the authorization is terminated or revoked.  Performed at [facility], [place] [address], [place], [place] [address]   Troponin I (High Sensitivity)     Status: None   Collection Time: 03/21/21  4:17 PM  Result Value Ref Range   Troponin I (High Sensitivity) 9 <18 ng/L    Comment: (NOTE) Elevated high sensitivity troponin I (hsTnI) values and significant  changes across serial measurements may suggest ACS but many other  chronic and acute  conditions are known to elevate hsTnI results.  Refer to the "Links" section for chest pain algorithms and additional  guidance. Performed at [facility], [place] [address], [place], [place] [address]    CT ABDOMEN PELVIS W CONTRAST  Result Date: 03/21/2021 CLINICAL DATA:  Acute generalized abdominal pain. EXAM: CT ABDOMEN AND PELVIS WITH CONTRAST TECHNIQUE: Multidetector CT imaging of the abdomen and pelvis was performed using the standard protocol following bolus administration of intravenous contrast. CONTRAST:  44m OMNIPAQUE IOHEXOL 350 MG/ML SOLN COMPARISON:  May 30, 2017. FINDINGS: Lower chest: No acute abnormality. Hepatobiliary: No focal liver abnormality is seen. Status post cholecystectomy. No biliary dilatation. Pancreas: Unremarkable. No pancreatic ductal dilatation or surrounding inflammatory changes. Spleen: Normal in size without focal abnormality. Adrenals/Urinary Tract: Adrenal glands appear normal. Left renal cyst is noted. No hydronephrosis or renal obstruction is noted. Urinary bladder is unremarkable. Stomach/Bowel: Stomach appears normal. There is no evidence of bowel obstruction or inflammation. The appendix is somewhat thickened compared to prior exam, currently measuring approximately 9 mm. Minimal surrounding inflammatory changes may be present. There also may be enhancement of the appendiceal wall. Vascular/Lymphatic: Aortic atherosclerosis. No enlarged abdominal or pelvic lymph nodes. Reproductive: Status post hysterectomy. No adnexal masses. Other: No abdominal wall hernia or abnormality. No abdominopelvic ascites. Musculoskeletal: No acute or significant osseous findings. IMPRESSION: The appendix is somewhat thickened compared to prior exam, currently measuring 9 mm in diameter. Possible minimal surrounding inflammatory changes may be present. These findings are concerning for possible appendicitis, and correlation with clinical and laboratory data is recommended.  Aortic Atherosclerosis (ICD10-I70.0). Electronically Signed   By: JMarijo ConceptionM.D.   On: 03/21/2021 17:15   DG Chest Port 1 View  Result Date: 03/21/2021 CLINICAL DATA:  Chest pain EXAM: PORTABLE CHEST 1 VIEW COMPARISON:  03/18/2021 FINDINGS: Low lung volumes. No consolidation or edema. No pleural effusion or pneumothorax. Cardiomediastinal contours are within normal limits. IMPRESSION: No acute process in the chest. Electronically  Signed   By: Macy Mis M.D.   On: 03/21/2021 14:46    Review of Systems  Constitutional:  Positive for appetite change. Negative for chills.  HENT: Negative.    Eyes: Negative.   Respiratory: Negative.    Cardiovascular:  Positive for chest pain.  Gastrointestinal:  Positive for abdominal distention, abdominal pain and diarrhea.  Endocrine: Negative.   Genitourinary: Negative.   Musculoskeletal: Negative.   Skin: Negative.   Allergic/Immunologic: Negative.   Neurological: Negative.   Hematological: Negative.   Psychiatric/Behavioral: Negative.     Blood pressure 130/66, pulse 86, temperature 99.2 F (37.3 C), temperature source Oral, resp. rate (!) 23, height '5\' 5"'$  (1.651 m), weight 92.1 kg, SpO2 93 %. Physical Exam Constitutional:      Appearance: She is well-developed. She is not ill-appearing.  HENT:     Head: Normocephalic.  Eyes:     Pupils: Pupils are equal, round, and reactive to light.  Cardiovascular:     Rate and Rhythm: Normal rate and regular rhythm.     Heart sounds: Normal heart sounds.  Pulmonary:     Effort: Pulmonary effort is normal. No tachypnea.     Breath sounds: Normal breath sounds. No wheezing or rhonchi.  Chest:     Chest wall: No tenderness.  Abdominal:     Palpations: Abdomen is soft. There is no mass.     Tenderness: There is no abdominal tenderness. There is no guarding or rebound.     Comments: No tenderness RLQ nor elsewhere  Musculoskeletal:        General: Normal range of motion.     Cervical back:  Normal range of motion.  Skin:    General: Skin is warm.     Capillary Refill: Capillary refill takes less than 2 seconds.  Neurological:     Mental Status: She is alert and oriented to person, place, and time.  Psychiatric:        Mood and Affect: Mood normal.     Assessment/Plan Abdominal pain -findings of the appendix are very subtle on CT scan.  She has no tenderness.  Her history is certainly not consistent with appendicitis.  Plan to admit for observation.  We will repeat labs and exam in a.m.  I discussed the plan thoroughly with her.  Zenovia Jarred, MD 03/21/2021, 6:49 PM

## 2021-03-22 ENCOUNTER — Inpatient Hospital Stay (HOSPITAL_COMMUNITY): Payer: Medicare HMO | Admitting: Certified Registered Nurse Anesthetist

## 2021-03-22 ENCOUNTER — Encounter (HOSPITAL_COMMUNITY): Payer: Self-pay | Admitting: General Surgery

## 2021-03-22 ENCOUNTER — Encounter (HOSPITAL_COMMUNITY): Admission: EM | Disposition: A | Discharge status: Home / Self Care | Payer: Self-pay | Source: Home / Self Care

## 2021-03-22 DIAGNOSIS — Z7984 Long term (current) use of oral hypoglycemic drugs: Secondary | ICD-10-CM | POA: Diagnosis not present

## 2021-03-22 DIAGNOSIS — Z8349 Family history of other endocrine, nutritional and metabolic diseases: Secondary | ICD-10-CM | POA: Diagnosis not present

## 2021-03-22 DIAGNOSIS — Z86010 Personal history of colonic polyps: Secondary | ICD-10-CM | POA: Diagnosis not present

## 2021-03-22 DIAGNOSIS — Z803 Family history of malignant neoplasm of breast: Secondary | ICD-10-CM | POA: Diagnosis not present

## 2021-03-22 DIAGNOSIS — Z90710 Acquired absence of both cervix and uterus: Secondary | ICD-10-CM | POA: Diagnosis not present

## 2021-03-22 DIAGNOSIS — E079 Disorder of thyroid, unspecified: Secondary | ICD-10-CM | POA: Diagnosis present

## 2021-03-22 DIAGNOSIS — Z808 Family history of malignant neoplasm of other organs or systems: Secondary | ICD-10-CM | POA: Diagnosis not present

## 2021-03-22 DIAGNOSIS — M1990 Unspecified osteoarthritis, unspecified site: Secondary | ICD-10-CM | POA: Diagnosis present

## 2021-03-22 DIAGNOSIS — Z832 Family history of diseases of the blood and blood-forming organs and certain disorders involving the immune mechanism: Secondary | ICD-10-CM | POA: Diagnosis not present

## 2021-03-22 DIAGNOSIS — Z79899 Other long term (current) drug therapy: Secondary | ICD-10-CM | POA: Diagnosis not present

## 2021-03-22 DIAGNOSIS — Z8041 Family history of malignant neoplasm of ovary: Secondary | ICD-10-CM | POA: Diagnosis not present

## 2021-03-22 DIAGNOSIS — E1140 Type 2 diabetes mellitus with diabetic neuropathy, unspecified: Secondary | ICD-10-CM | POA: Diagnosis present

## 2021-03-22 DIAGNOSIS — Z794 Long term (current) use of insulin: Secondary | ICD-10-CM | POA: Diagnosis not present

## 2021-03-22 DIAGNOSIS — R1031 Right lower quadrant pain: Secondary | ICD-10-CM | POA: Diagnosis present

## 2021-03-22 DIAGNOSIS — Z79890 Hormone replacement therapy (postmenopausal): Secondary | ICD-10-CM | POA: Diagnosis not present

## 2021-03-22 DIAGNOSIS — Z888 Allergy status to other drugs, medicaments and biological substances status: Secondary | ICD-10-CM | POA: Diagnosis not present

## 2021-03-22 DIAGNOSIS — Z20822 Contact with and (suspected) exposure to covid-19: Secondary | ICD-10-CM | POA: Diagnosis present

## 2021-03-22 DIAGNOSIS — Z791 Long term (current) use of non-steroidal anti-inflammatories (NSAID): Secondary | ICD-10-CM | POA: Diagnosis not present

## 2021-03-22 DIAGNOSIS — Z8249 Family history of ischemic heart disease and other diseases of the circulatory system: Secondary | ICD-10-CM | POA: Diagnosis not present

## 2021-03-22 DIAGNOSIS — Z9049 Acquired absence of other specified parts of digestive tract: Secondary | ICD-10-CM | POA: Diagnosis not present

## 2021-03-22 DIAGNOSIS — Z886 Allergy status to analgesic agent status: Secondary | ICD-10-CM | POA: Diagnosis not present

## 2021-03-22 DIAGNOSIS — Z823 Family history of stroke: Secondary | ICD-10-CM | POA: Diagnosis not present

## 2021-03-22 DIAGNOSIS — Z833 Family history of diabetes mellitus: Secondary | ICD-10-CM | POA: Diagnosis not present

## 2021-03-22 DIAGNOSIS — R109 Unspecified abdominal pain: Secondary | ICD-10-CM | POA: Diagnosis present

## 2021-03-22 HISTORY — PX: LAPAROSCOPIC APPENDECTOMY: SHX408

## 2021-03-22 LAB — CBC
HCT: 32.8 % — ABNORMAL LOW (ref 36.0–46.0)
Hemoglobin: 9.5 g/dL — ABNORMAL LOW (ref 12.0–15.0)
MCH: 23.7 pg — ABNORMAL LOW (ref 26.0–34.0)
MCHC: 29 g/dL — ABNORMAL LOW (ref 30.0–36.0)
MCV: 81.8 fL (ref 80.0–100.0)
Platelets: 240 10*3/uL (ref 150–400)
RBC: 4.01 MIL/uL (ref 3.87–5.11)
RDW: 16.1 % — ABNORMAL HIGH (ref 11.5–15.5)
WBC: 10.7 10*3/uL — ABNORMAL HIGH (ref 4.0–10.5)
nRBC: 0 % (ref 0.0–0.2)

## 2021-03-22 LAB — BASIC METABOLIC PANEL
Anion gap: 8 (ref 5–15)
BUN: 5 mg/dL — ABNORMAL LOW (ref 8–23)
CO2: 28 mmol/L (ref 22–32)
Calcium: 9.2 mg/dL (ref 8.9–10.3)
Chloride: 102 mmol/L (ref 98–111)
Creatinine, Ser: 0.97 mg/dL (ref 0.44–1.00)
GFR, Estimated: >60 mL/min (ref >60)
Glucose, Bld: 124 mg/dL — ABNORMAL HIGH (ref 70–99)
Potassium: 4 mmol/L (ref 3.5–5.1)
Sodium: 138 mmol/L (ref 135–145)

## 2021-03-22 LAB — CBG MONITORING, ED
Glucose-Capillary: 106 mg/dL — ABNORMAL HIGH (ref 70–99)
Glucose-Capillary: 123 mg/dL — ABNORMAL HIGH (ref 70–99)
Glucose-Capillary: 124 mg/dL — ABNORMAL HIGH (ref 70–99)
Glucose-Capillary: 127 mg/dL — ABNORMAL HIGH (ref 70–99)
Glucose-Capillary: 128 mg/dL — ABNORMAL HIGH (ref 70–99)

## 2021-03-22 LAB — GLUCOSE, CAPILLARY
Glucose-Capillary: 153 mg/dL — ABNORMAL HIGH (ref 70–99)
Glucose-Capillary: 212 mg/dL — ABNORMAL HIGH (ref 70–99)
Glucose-Capillary: 231 mg/dL — ABNORMAL HIGH (ref 70–99)

## 2021-03-22 SURGERY — APPENDECTOMY, LAPAROSCOPIC
Anesthesia: General | Site: Abdomen

## 2021-03-22 MED ORDER — FENTANYL CITRATE (PF) 250 MCG/5ML IJ SOLN
INTRAMUSCULAR | Status: DC | PRN
  Administered 2021-03-22: 50 ug via INTRAVENOUS
  Administered 2021-03-22: 75 ug via INTRAVENOUS

## 2021-03-22 MED ORDER — LIDOCAINE HCL (PF) 1 % IJ SOLN
INTRAMUSCULAR | Status: AC
  Filled 2021-03-22: qty 30

## 2021-03-22 MED ORDER — ROCURONIUM BROMIDE 10 MG/ML (PF) SYRINGE
PREFILLED_SYRINGE | INTRAVENOUS | Status: AC
  Filled 2021-03-22: qty 20

## 2021-03-22 MED ORDER — LIDOCAINE HCL 1 % IJ SOLN
INTRAMUSCULAR | Status: DC | PRN
  Administered 2021-03-22: 13 mL via INTRAMUSCULAR

## 2021-03-22 MED ORDER — CHLORHEXIDINE GLUCONATE 0.12 % MT SOLN: 15.0000 mL | Freq: Once | OROMUCOSAL | Status: AC

## 2021-03-22 MED ORDER — PHENYLEPHRINE 40 MCG/ML (10ML) SYRINGE FOR IV PUSH (FOR BLOOD PRESSURE SUPPORT)
PREFILLED_SYRINGE | INTRAVENOUS | Status: DC | PRN
  Administered 2021-03-22: 80 ug via INTRAVENOUS

## 2021-03-22 MED ORDER — PROPOFOL 500 MG/50ML IV EMUL
INTRAVENOUS | Status: DC | PRN
  Administered 2021-03-22: 25 ug/kg/min via INTRAVENOUS

## 2021-03-22 MED ORDER — ONDANSETRON HCL 4 MG/2ML IJ SOLN
INTRAMUSCULAR | Status: AC
  Filled 2021-03-22: qty 4

## 2021-03-22 MED ORDER — ORAL CARE MOUTH RINSE: 15.0000 mL | Freq: Once | OROMUCOSAL | Status: AC

## 2021-03-22 MED ORDER — LACTATED RINGERS IV SOLN: INTRAVENOUS | Status: DC

## 2021-03-22 MED ORDER — SUCCINYLCHOLINE CHLORIDE 200 MG/10ML IV SOSY
PREFILLED_SYRINGE | INTRAVENOUS | Status: AC
  Filled 2021-03-22: qty 10

## 2021-03-22 MED ORDER — ROCURONIUM BROMIDE 10 MG/ML (PF) SYRINGE
PREFILLED_SYRINGE | INTRAVENOUS | Status: DC | PRN
  Administered 2021-03-22: 50 mg via INTRAVENOUS

## 2021-03-22 MED ORDER — SUGAMMADEX SODIUM 200 MG/2ML IV SOLN
INTRAVENOUS | Status: DC | PRN
  Administered 2021-03-22: 200 mg via INTRAVENOUS

## 2021-03-22 MED ORDER — PROPOFOL 10 MG/ML IV BOLUS
INTRAVENOUS | Status: DC | PRN
  Administered 2021-03-22: 130 mg via INTRAVENOUS

## 2021-03-22 MED ORDER — FENTANYL CITRATE (PF) 100 MCG/2ML IJ SOLN
25.0000 ug | INTRAMUSCULAR | Status: DC | PRN
  Administered 2021-03-22 (×2): 25 ug via INTRAVENOUS

## 2021-03-22 MED ORDER — BUPIVACAINE-EPINEPHRINE (PF) 0.25% -1:200000 IJ SOLN
INTRAMUSCULAR | Status: AC
  Filled 2021-03-22: qty 30

## 2021-03-22 MED ORDER — INSULIN ASPART 100 UNIT/ML IJ SOLN
0.0000 [IU] | Freq: Three times a day (TID) | INTRAMUSCULAR | Status: DC
  Administered 2021-03-23: 8 [IU] via SUBCUTANEOUS

## 2021-03-22 MED ORDER — DEXAMETHASONE SODIUM PHOSPHATE 10 MG/ML IJ SOLN
INTRAMUSCULAR | Status: DC | PRN
  Administered 2021-03-22: 4 mg via INTRAVENOUS

## 2021-03-22 MED ORDER — LIDOCAINE 2% (20 MG/ML) 5 ML SYRINGE
INTRAMUSCULAR | Status: DC | PRN
  Administered 2021-03-22: 40 mg via INTRAVENOUS

## 2021-03-22 MED ORDER — FENTANYL CITRATE (PF) 250 MCG/5ML IJ SOLN
INTRAMUSCULAR | Status: AC
  Filled 2021-03-22: qty 5

## 2021-03-22 MED ORDER — KCL IN DEXTROSE-NACL 20-5-0.45 MEQ/L-%-% IV SOLN
INTRAVENOUS | Status: DC
  Filled 2021-03-22 (×3): qty 1000

## 2021-03-22 MED ORDER — 0.9 % SODIUM CHLORIDE (POUR BTL) OPTIME
TOPICAL | Status: DC | PRN
  Administered 2021-03-22: 1000 mL

## 2021-03-22 MED ORDER — CHLORHEXIDINE GLUCONATE 0.12 % MT SOLN
OROMUCOSAL | Status: AC
  Administered 2021-03-22: 15 mL via OROMUCOSAL
  Filled 2021-03-22: qty 15

## 2021-03-22 MED ORDER — MIDAZOLAM HCL 2 MG/2ML IJ SOLN
INTRAMUSCULAR | Status: AC
  Filled 2021-03-22: qty 2

## 2021-03-22 MED ORDER — FENTANYL CITRATE (PF) 100 MCG/2ML IJ SOLN
INTRAMUSCULAR | Status: AC
  Filled 2021-03-22: qty 2

## 2021-03-22 MED ORDER — ENOXAPARIN SODIUM 40 MG/0.4ML IJ SOSY: 40.0000 mg | PREFILLED_SYRINGE | INTRAMUSCULAR | Status: DC

## 2021-03-22 MED ORDER — ONDANSETRON HCL 4 MG/2ML IJ SOLN
INTRAMUSCULAR | Status: DC | PRN
  Administered 2021-03-22: 4 mg via INTRAVENOUS

## 2021-03-22 MED ORDER — DEXAMETHASONE SODIUM PHOSPHATE 10 MG/ML IJ SOLN
INTRAMUSCULAR | Status: AC
  Filled 2021-03-22: qty 1

## 2021-03-22 MED ORDER — SODIUM CHLORIDE 0.9 % IR SOLN
Status: DC | PRN
  Administered 2021-03-22: 1000 mL

## 2021-03-22 MED ORDER — LIDOCAINE 2% (20 MG/ML) 5 ML SYRINGE
INTRAMUSCULAR | Status: AC
  Filled 2021-03-22: qty 15

## 2021-03-22 MED ORDER — PROPOFOL 10 MG/ML IV BOLUS
INTRAVENOUS | Status: AC
  Filled 2021-03-22: qty 20

## 2021-03-22 SURGICAL SUPPLY — 49 items
ADH SKN CLS APL DERMABOND .7 (GAUZE/BANDAGES/DRESSINGS) ×1
APL PRP STRL LF DISP 70% ISPRP (MISCELLANEOUS) ×1
APPLIER CLIP ROT 10 11.4 M/L (STAPLE)
APR CLP MED LRG 11.4X10 (STAPLE)
BAG SPEC RTRVL 10 TROC 200 (ENDOMECHANICALS) ×1
BLADE CLIPPER SURG (BLADE) IMPLANT
CANISTER SUCT 3000ML PPV (MISCELLANEOUS) ×2 IMPLANT
CATH FOLEY 2WAY SLVR  5CC 14FR (CATHETERS) ×2
CATH FOLEY 2WAY SLVR 5CC 14FR (CATHETERS) ×1 IMPLANT
CHLORAPREP W/TINT 26 (MISCELLANEOUS) ×2 IMPLANT
CLIP APPLIE ROT 10 11.4 M/L (STAPLE) IMPLANT
COVER SURGICAL LIGHT HANDLE (MISCELLANEOUS) ×2 IMPLANT
CUTTER FLEX LINEAR 45M (STAPLE) ×2 IMPLANT
DERMABOND ADVANCED (GAUZE/BANDAGES/DRESSINGS) ×1
DERMABOND ADVANCED .7 DNX12 (GAUZE/BANDAGES/DRESSINGS) ×1 IMPLANT
ELECT REM PT RETURN 9FT ADLT (ELECTROSURGICAL) ×2
ELECTRODE REM PT RTRN 9FT ADLT (ELECTROSURGICAL) ×1 IMPLANT
ENDOLOOP SUT PDS II  0 18 (SUTURE)
ENDOLOOP SUT PDS II 0 18 (SUTURE) IMPLANT
GLOVE SURG ENC MOIS LTX SZ6 (GLOVE) ×2 IMPLANT
GLOVE SURG UNDER LTX SZ6.5 (GLOVE) ×2 IMPLANT
GOWN STRL REUS W/ TWL LRG LVL3 (GOWN DISPOSABLE) ×2 IMPLANT
GOWN STRL REUS W/TWL 2XL LVL3 (GOWN DISPOSABLE) ×2 IMPLANT
GOWN STRL REUS W/TWL LRG LVL3 (GOWN DISPOSABLE) ×4
KIT BASIN OR (CUSTOM PROCEDURE TRAY) ×2 IMPLANT
KIT TURNOVER KIT B (KITS) ×2 IMPLANT
NS IRRIG 1000ML POUR BTL (IV SOLUTION) ×2 IMPLANT
PAD ARMBOARD 7.5X6 YLW CONV (MISCELLANEOUS) ×4 IMPLANT
POUCH RETRIEVAL ECOSAC 10 (ENDOMECHANICALS) ×1 IMPLANT
POUCH RETRIEVAL ECOSAC 10MM (ENDOMECHANICALS) ×2
RELOAD STAPLE TA45 3.5 REG BLU (ENDOMECHANICALS) ×2 IMPLANT
SET IRRIG TUBING LAPAROSCOPIC (IRRIGATION / IRRIGATOR) ×2 IMPLANT
SET TUBE SMOKE EVAC HIGH FLOW (TUBING) ×2 IMPLANT
SHEARS HARMONIC HDI 36CM (ELECTROSURGICAL) ×2 IMPLANT
SLEEVE ENDOPATH XCEL 5M (ENDOMECHANICALS) ×2 IMPLANT
SPECIMEN JAR SMALL (MISCELLANEOUS) ×2 IMPLANT
STRIP CLOSURE SKIN 1/2X4 (GAUZE/BANDAGES/DRESSINGS) ×2 IMPLANT
SUT MNCRL AB 4-0 PS2 18 (SUTURE) ×2 IMPLANT
SUT VICRYL 0 UR6 27IN ABS (SUTURE) ×2 IMPLANT
TOWEL GREEN STERILE (TOWEL DISPOSABLE) ×2 IMPLANT
TOWEL GREEN STERILE FF (TOWEL DISPOSABLE) ×2 IMPLANT
TRAY FOLEY W/BAG SLVR 14FR (SET/KITS/TRAYS/PACK) ×2 IMPLANT
TRAY FOLEY W/BAG SLVR 16FR (SET/KITS/TRAYS/PACK) ×2
TRAY FOLEY W/BAG SLVR 16FR ST (SET/KITS/TRAYS/PACK) ×1 IMPLANT
TRAY LAPAROSCOPIC MC (CUSTOM PROCEDURE TRAY) ×2 IMPLANT
TROCAR XCEL BLUNT TIP 100MML (ENDOMECHANICALS) ×2 IMPLANT
TROCAR XCEL NON-BLD 5MMX100MML (ENDOMECHANICALS) ×2 IMPLANT
WARMER LAPAROSCOPE (MISCELLANEOUS) ×2 IMPLANT
WATER STERILE IRR 1000ML POUR (IV SOLUTION) ×2 IMPLANT

## 2021-03-22 NOTE — Anesthesia Procedure Notes (Signed)
Procedure Name: Intubation Date/Time: 03/22/2021 4:13 PM Performed by: Carolan Clines, CRNA Pre-anesthesia Checklist: Patient identified, Emergency Drugs available, Suction available and Patient being monitored Patient Re-evaluated:Patient Re-evaluated prior to induction Oxygen Delivery Method: Circle System Utilized Preoxygenation: Pre-oxygenation with 100% oxygen Induction Type: IV induction Ventilation: Mask ventilation without difficulty Laryngoscope Size: Mac and 3 Grade View: Grade II Tube type: Oral Number of attempts: 1 Airway Equipment and Method: Stylet Placement Confirmation: ETT inserted through vocal cords under direct vision, positive ETCO2 and breath sounds checked- equal and bilateral Secured at: 22 cm Tube secured with: Tape Dental Injury: Teeth and Oropharynx as per pre-operative assessment

## 2021-03-22 NOTE — Anesthesia Preprocedure Evaluation (Addendum)
Anesthesia Evaluation  Patient identified by MRN, date of birth, ID band Patient awake    Reviewed: Allergy & Precautions, NPO status , Patient's Chart, lab work & pertinent test results  History of Anesthesia Complications (+) PONV and history of anesthetic complications  Airway Mallampati: II  TM Distance: >3 FB Neck ROM: Full    Dental  (+) Teeth Intact, Dental Advisory Given   Pulmonary neg pulmonary ROS,    breath sounds clear to auscultation       Cardiovascular negative cardio ROS   Rhythm:Regular Rate:Normal     Neuro/Psych negative neurological ROS  negative psych ROS   GI/Hepatic negative GI ROS, Neg liver ROS,   Endo/Other  diabetes, Type 2, Oral Hypoglycemic Agents, Insulin Dependent  Renal/GU negative Renal ROS     Musculoskeletal  (+) Arthritis ,   Abdominal Normal abdominal exam  (+)   Peds  Hematology negative hematology ROS (+)   Anesthesia Other Findings   Reproductive/Obstetrics                            Anesthesia Physical Anesthesia Plan  ASA: 3  Anesthesia Plan: General   Post-op Pain Management:    Induction: Intravenous  PONV Risk Score and Plan: 4 or greater and Ondansetron, Midazolam and Treatment may vary due to age or medical condition  Airway Management Planned: Oral ETT  Additional Equipment: None  Intra-op Plan:   Post-operative Plan: Extubation in OR  Informed Consent: I have reviewed the patients History and Physical, chart, labs and discussed the procedure including the risks, benefits and alternatives for the proposed anesthesia with the patient or authorized representative who has indicated his/her understanding and acceptance.     Dental advisory given  Plan Discussed with: CRNA  Anesthesia Plan Comments:        Anesthesia Quick Evaluation

## 2021-03-22 NOTE — Op Note (Signed)
Appendectomy, Lap, Procedure Note  Indications: The patient presented with a history of right-sided abdominal pain. A CT revealed an abnormal appendix  Pre-operative Diagnosis: Abnormal appendix  Post-operative Diagnosis: same  Surgeon: Stark Klein   Assistants: Pryor Curia, RNFA  Anesthesia: General endotracheal anesthesia and Local anesthesia 1% plain lidocaine, 0.25.% bupivacaine, with epinephrine  ASA Class: 3  Procedure Details  The patient was seen again in the Holding Room. The risks, benefits, complications, treatment options, and expected outcomes were discussed with the patient and/or family. The possibilities of perforation of viscus, bleeding, recurrent infection, the need for additional procedures, failure to diagnose a condition, and creating a complication requiring transfusion or operation were discussed. There was concurrence with the proposed plan and informed consent was obtained. The site of surgery was properly noted. The patient was taken to Operating Room, identified as Pamela Murray and the procedure verified as Appendectomy. A Time Out was held and the above information confirmed.  The patient was placed in the supine position and general anesthesia was induced, along with placement of orogastric tube, Venodyne boots, and a Foley catheter. The abdomen was prepped and draped in a sterile fashion. Local anesthetic was infiltrated in the infraumbilical region.  A 1.5 cm infraumbilical incision was made just below the umbilicus.  The Kelly clamp was used to spread the subcutaneous tissues.  The fascia was elevated with 2 Kocher clamps and incised with the #11 blade.  A Claiborne Billings was used to confirm entrance into the peritoneal cavity.  A pursestring suture was placed around the fascial incision.  The Hasson trocar was inserted into the abdomen and held in place with the tails of the suture.  The pneumoperitoneum was then established to steady pressure of 15 mmHg.      An additional 5 mm cannula was then placed in the left lower quadrant of the abdomen.  There were adhesions of several loops of small bowel to the lower abdominal incision.  These were very stuck and not easily taken down, so they were left in place to avoid enterotomy.   The third trocar was placed in the upper midline/RUQ. A careful evaluation of the entire abdomen was carried out. The patient was placed in Trendelenburg and rotated to the left.   The patient was found to have an enlarged appendix with mild inflammation in the RLQ position. There was no evidence of perforation.  The appendix was carefully dissected. The appendix was was skeletonized with the harmonic scalpel.   The appendix was divided at its base using an endo-GIA stapler. Minimal appendiceal stump was left in place. The appendix was removed from the abdomen with an Endocatch bag through the umbilical port.  There was no evidence of bleeding, leakage, or complication after division of the appendix. Irrigation was also performed and irrigate suctioned from the abdomen as well.  The 5 mm trocars were removed.  The pneumoperitoneum was evacuated from the abdomen.    The periumbilical fascial incision was closed with the pursestring suture.  The residual palpable fascial defect was closed with another 0-0 vicryl.  The trocar site skin wounds were closed with 4-0 Monocryl and dressed with Dermabond.  Instrument, sponge, and needle counts were correct at the conclusion of the case.   Findings: The appendix was found to be mildly inflamed and mildly thickened. There were not signs of necrosis.  There was not perforation. There was not abscess formation.  Estimated Blood Loss:  Minimal  Drains: none          Specimens: appendix to pathology         Complications:  None; patient tolerated the procedure well.         Disposition: PACU - hemodynamically stable.         Condition: stable

## 2021-03-22 NOTE — Transfer of Care (Signed)
Immediate Anesthesia Transfer of Care Note  Patient: Pamela Murray  Procedure(s) Performed: APPENDECTOMY LAPAROSCOPIC (Abdomen)  Patient Location: PACU  Anesthesia Type:General  Level of Consciousness: drowsy  Airway & Oxygen Therapy: Patient Spontanous Breathing and Patient connected to face mask oxygen  Post-op Assessment: Report given to RN and Post -op Vital signs reviewed and stable  Post vital signs: Reviewed and stable  Last Vitals:  Vitals Value Taken Time  BP 146/73 03/22/21 1729  Temp    Pulse 98 03/22/21 1733  Resp 14 03/22/21 1733  SpO2 99 % 03/22/21 1733  Vitals shown include unvalidated device data.  Last Pain:  Vitals:   03/22/21 1456  TempSrc: Oral  PainSc:          Complications: No notable events documented.

## 2021-03-22 NOTE — Anesthesia Postprocedure Evaluation (Signed)
Anesthesia Post Note  Patient: Pamela Murray  Procedure(s) Performed: APPENDECTOMY LAPAROSCOPIC (Abdomen)     Patient location during evaluation: PACU Anesthesia Type: General Level of consciousness: awake and alert Pain management: pain level controlled Vital Signs Assessment: post-procedure vital signs reviewed and stable Respiratory status: spontaneous breathing, nonlabored ventilation and respiratory function stable Cardiovascular status: blood pressure returned to baseline and stable Postop Assessment: no apparent nausea or vomiting Anesthetic complications: no   No notable events documented.  Last Vitals:  Vitals:   03/22/21 1815 03/22/21 1900  BP: 120/89 131/65  Pulse: 99 87  Resp: 17 15  Temp: 36.6 C (!) 36.2 C  SpO2: 96% 98%    Last Pain:  Vitals:   03/22/21 1900  TempSrc: Oral  PainSc:                  [name]

## 2021-03-22 NOTE — Progress Notes (Signed)
Subjective: CC: Patient reports that her abdominal pain has improved since last night after receiving pain medication. Currently no pain at rest but does have pain with palpation. She reports 4 weeks ago she had gradually onset of RLQ abdominal pain that occasionally will spread across her entire abdomen. Her pain is constant and gradually worsened until 2 weeks ago when it worsened. The pain will get up to a 7/10. It has not worsened recently. She was able to have some clears last night without worsening pain. No associated n/v. Passing flatus. Normal bm yesterday and has daily bm's. Last colonoscopy 5 years ago per patient report w/ Dr. Kathryne Hitch. She gets colonoscopy's every 5 years for polyps. GI note from 12/08/20 reports hx "adenomatous colon polyps, nonadvanced neoplasia found in the 2019, follow-up in 5 years". I am not able to see report for colonoscopy on 07/25/17 on care everywhere. She did have a ha this morning. No visual changes, dizziness, weakness or numbness. Denies cp or sob. Hx of prior cholecystectomy and hysterectomy. She is not on blood thinners. She has a family hx of cancer (Mother - thyroid ca, ovarian ca; sister - breast ca; sister - breast ca).   Objective: Vital signs in last 24 hours: Temp:  [98.9 F (37.2 C)-99.2 F (37.3 C)] 98.9 F (37.2 C) (09/13 0800) Pulse Rate:  [74-105] 79 (09/13 0800) Resp:  [16-23] 18 (09/13 0645) BP: (112-157)/(63-110) 157/71 (09/13 0800) SpO2:  [92 %-97 %] 96 % (09/13 0800) Weight:  [92.1 kg] 92.1 kg (09/12 1339)    Intake/Output from previous day: No intake/output data recorded. Intake/Output this shift: No intake/output data recorded.  PE: Gen:  Alert, NAD, pleasant HEENT: EOM's intact, pupils equal and round Card:  RRR Pulm:  CTAB, no W/R/R, effort normal Abd: Soft, ND, generalized tenderness greatest in the RLQ without peritonitis, +BS Ext:  No LE edema or calf tenderness Psych: A&Ox3  Neuro: CN 3-12 grossly  intact. Equal strength for BUE and BLE and appropriate for age. SILT to BUE and BLE's. Moves all extremities. Gait not assessed. Skin: no rashes noted, warm and dry  Lab Results:  Recent Labs    03/21/21 1343 03/22/21 0650  WBC 14.7* 10.7*  HGB 10.6* 9.5*  HCT 35.9* 32.8*  PLT 302 240   BMET Recent Labs    03/21/21 1343 03/22/21 0650  NA 139 138  K 4.4 4.0  CL 103 102  CO2 23 28  GLUCOSE 122* 124*  BUN 12 5*  CREATININE 0.99 0.97  CALCIUM 9.2 9.2   PT/INR No results for input(s): LABPROT, INR in the last 72 hours. CMP     Component Value Date/Time   NA 138 03/22/2021 0650   K 4.0 03/22/2021 0650   CL 102 03/22/2021 0650   CO2 28 03/22/2021 0650   GLUCOSE 124 (H) 03/22/2021 0650   BUN 5 (L) 03/22/2021 0650   CREATININE 0.97 03/22/2021 0650   CALCIUM 9.2 03/22/2021 0650   PROT 6.2 (L) 03/21/2021 1343   ALBUMIN 4.0 03/21/2021 1343   AST 23 03/21/2021 1343   ALT 18 03/21/2021 1343   ALKPHOS 42 03/21/2021 1343   BILITOT 0.3 03/21/2021 1343   GFRNONAA >60 03/22/2021 0650   GFRAA 54 (L) 05/30/2017 1725   Lipase     Component Value Date/Time   LIPASE 29 03/21/2021 1343    Studies/Results: CT ABDOMEN PELVIS W CONTRAST  Result Date: 03/21/2021 CLINICAL DATA:  Acute generalized abdominal pain. EXAM: CT  ABDOMEN AND PELVIS WITH CONTRAST TECHNIQUE: Multidetector CT imaging of the abdomen and pelvis was performed using the standard protocol following bolus administration of intravenous contrast. CONTRAST:  35m OMNIPAQUE IOHEXOL 350 MG/ML SOLN COMPARISON:  May 30, 2017. FINDINGS: Lower chest: No acute abnormality. Hepatobiliary: No focal liver abnormality is seen. Status post cholecystectomy. No biliary dilatation. Pancreas: Unremarkable. No pancreatic ductal dilatation or surrounding inflammatory changes. Spleen: Normal in size without focal abnormality. Adrenals/Urinary Tract: Adrenal glands appear normal. Left renal cyst is noted. No hydronephrosis or renal  obstruction is noted. Urinary bladder is unremarkable. Stomach/Bowel: Stomach appears normal. There is no evidence of bowel obstruction or inflammation. The appendix is somewhat thickened compared to prior exam, currently measuring approximately 9 mm. Minimal surrounding inflammatory changes may be present. There also may be enhancement of the appendiceal wall. Vascular/Lymphatic: Aortic atherosclerosis. No enlarged abdominal or pelvic lymph nodes. Reproductive: Status post hysterectomy. No adnexal masses. Other: No abdominal wall hernia or abnormality. No abdominopelvic ascites. Musculoskeletal: No acute or significant osseous findings. IMPRESSION: The appendix is somewhat thickened compared to prior exam, currently measuring 9 mm in diameter. Possible minimal surrounding inflammatory changes may be present. These findings are concerning for possible appendicitis, and correlation with clinical and laboratory data is recommended. Aortic Atherosclerosis (ICD10-I70.0). Electronically Signed   By: JMarijo ConceptionM.D.   On: 03/21/2021 17:15   DG Chest Port 1 View  Result Date: 03/21/2021 CLINICAL DATA:  Chest pain EXAM: PORTABLE CHEST 1 VIEW COMPARISON:  03/18/2021 FINDINGS: Low lung volumes. No consolidation or edema. No pleural effusion or pneumothorax. Cardiomediastinal contours are within normal limits. IMPRESSION: No acute process in the chest. Electronically Signed   By: PMacy MisM.D.   On: 03/21/2021 14:46    Anti-infectives: Anti-infectives (From admission, onward)    Start     Dose/Rate Route Frequency Ordered Stop   03/21/21 2020  cefTRIAXone (ROCEPHIN) 2 g in sodium chloride 0.9 % 100 mL IVPB       See Hyperspace for full Linked Orders Report.   2 g 200 mL/hr over 30 Minutes Intravenous Every 24 hours 03/21/21 2020 03/28/21 2029   03/21/21 2020  metroNIDAZOLE (FLAGYL) IVPB 500 mg       See Hyperspace for full Linked Orders Report.   500 mg 100 mL/hr over 60 Minutes Intravenous Every 12  hours 03/21/21 2020 03/28/21 2029        Assessment/Plan Abdominal pain  - Patients CT scan w/ [id number] w/ possible minimal surrounding inflammatory changes - She is on abx. Afebrile. WBC 14.7 > 10.73 - Her story is definitely not classic for appendicitis but she does have RLQ tenderness on exam.  - Will review case with Dr. ByBarry DienesFurther recs to follow  FEN - NPO, IVF VTE - SCDs, Lovenox ID - Rocephin/Flagyl  DM2 - SSI Thyroid disease - home meds    LOS: 0 days    MiJillyn Ledger PACharleston Va Medical Centerurgery 03/22/2021, 8:08 AM Please see Amion for pager number during day hours 7:00am-4:30pm

## 2021-03-22 NOTE — ED Notes (Signed)
Pt was placed on 2 L [place] for SpO2 88% on RA after receiving morphine

## 2021-03-23 ENCOUNTER — Encounter (HOSPITAL_COMMUNITY): Payer: Self-pay | Admitting: General Surgery

## 2021-03-23 LAB — GLUCOSE, CAPILLARY: Glucose-Capillary: 288 mg/dL — ABNORMAL HIGH (ref 70–99)

## 2021-03-23 MED ORDER — ACETAMINOPHEN 325 MG PO TABS: 650.0000 mg | ORAL_TABLET | ORAL | Status: AC | PRN

## 2021-03-23 MED ORDER — OXYCODONE HCL 5 MG PO TABS: 5.0000 mg | ORAL_TABLET | Freq: Four times a day (QID) | ORAL | 0 refills | Status: AC | PRN

## 2021-03-23 MED ORDER — PANTOPRAZOLE SODIUM 40 MG PO TBEC: 40.0000 mg | DELAYED_RELEASE_TABLET | Freq: Every day | ORAL | Status: DC

## 2021-03-23 NOTE — Progress Notes (Addendum)
1 Day Post-Op    CC: Abdominal pain  Subjective: Patient is tolerating diet well.  She is ambulated to the bathroom without issue.  Her port sites all look fine.  Objective: Vital signs in last 24 hours: Temp:  [97.1 F (36.2 C)-98 F (36.7 C)] 97.7 F (36.5 C) (09/14 0926) Pulse Rate:  [68-101] 68 (09/14 0926) Resp:  [13-25] 13 (09/14 0926) BP: (102-146)/(55-89) 102/60 (09/14 0926) SpO2:  [89 %-100 %] 89 % (09/14 0926) 2189 IV recorded Nothing p.o. recorded Urine x2 Afebrile vital signs are stable No labs today CBGs 153-288 Intake/Output from previous day: 09/13 0701 - 09/14 0700 In: 2189 [I.V.:1389; IV Piggyback:800] Out: 20 [Blood:20] Intake/Output this shift: No intake/output data recorded.  General appearance: alert, cooperative, and no distress Resp: clear to auscultation bilaterally GI: Soft, sore, port sites all look fine.  She is tolerating a soft diet.  Lab Results:  Recent Labs    03/21/21 1343 03/22/21 0650  WBC 14.7* 10.7*  HGB 10.6* 9.5*  HCT 35.9* 32.8*  PLT 302 240    BMET Recent Labs    03/21/21 1343 03/22/21 0650  NA 139 138  K 4.4 4.0  CL 103 102  CO2 23 28  GLUCOSE 122* 124*  BUN 12 5*  CREATININE 0.99 0.97  CALCIUM 9.2 9.2   PT/INR No results for input(s): LABPROT, INR in the last 72 hours.  Recent Labs  Lab 03/21/21 1343  AST 23  ALT 18  ALKPHOS 42  BILITOT 0.3  PROT 6.2*  ALBUMIN 4.0     Lipase     Component Value Date/Time   LIPASE 29 03/21/2021 1343   Prior to Admission medications   Medication Sig Start Date End Date Taking? Authorizing Provider  cholecalciferol (VITAMIN D) 1000 units tablet Take 1,000 Units by mouth daily.   Yes [provider]  cyanocobalamin (,VITAMIN B-12,) 1000 MCG/ML injection Inject 1,000 mcg into the muscle every 30 (thirty) days. 07/13/16  Yes [provider]  dicyclomine (BENTYL) 10 MG capsule Take 10 mg by mouth 3 (three) times daily.    Yes [provider]   estradiol (ESTRACE) 1 MG tablet Take 1 mg by mouth daily.   Yes [provider]  fexofenadine (ALLEGRA) 180 MG tablet Take 180 mg by mouth daily.   Yes [provider]  furosemide (LASIX) 20 MG tablet Take 20 mg by mouth.   Yes [provider]  HYDROcodone-acetaminophen (NORCO) 7.5-325 MG tablet Take 1 tablet by mouth 2 (two) times daily.    Yes [provider]  insulin isophane & regular human KwikPen (HUMULIN 70/30 MIX) (70-30) 100 UNIT/ML KwikPen Inject into the skin See admin instructions. Inject 40 units subcutaneously before breakfast, and inject 25 units subcutaneously before dinner.   Yes [provider]  levothyroxine (SYNTHROID, LEVOTHROID) 25 MCG tablet Take 25 mcg by mouth daily before breakfast.   Yes [provider]  lisinopril (PRINIVIL,ZESTRIL) 5 MG tablet Take 5 mg by mouth daily.   Yes [provider]  meloxicam (MOBIC) 7.5 MG tablet Take 7.5 mg by mouth daily.   Yes [provider]  metFORMIN (GLUMETZA) 1000 MG (MOD) 24 hr tablet Take 1,000 mg by mouth 2 (two) times daily with a meal.    Yes [provider]  PARoxetine (PAXIL-CR) 25 MG 24 hr tablet Take 25 mg by mouth at bedtime.    Yes [provider]  Polyethyl Glycol-Propyl Glycol (SYSTANE) 0.4-0.3 % SOLN Place 2 drops into both  eyes daily.   Yes [provider]  RABEprazole (ACIPHEX) 20 MG tablet Take 20 mg by mouth daily.   Yes [provider]  sitaGLIPtin (JANUVIA) 100 MG tablet Take 100 mg by mouth daily.   Yes [provider]  sodium chloride (OCEAN) 0.65 % SOLN nasal spray Place 1 spray into both nostrils as needed for congestion.   Yes [provider]      Medications:  dicyclomine  10 mg Oral TID   enoxaparin (LOVENOX) injection  40 mg Subcutaneous Q24H   estradiol  1 mg Oral Daily   furosemide  20 mg Oral Daily   insulin aspart  0-15 Units Subcutaneous TID WC & HS   levothyroxine  25 mcg  Oral Q0600   lisinopril  5 mg Oral Daily   pantoprazole  40 mg Oral QHS   PARoxetine  25 mg Oral QHS   polyvinyl alcohol  1 drop Both Eyes Daily    cefTRIAXone (ROCEPHIN)  IV Stopped (03/22/21 2224)   And   metronidazole 500 mg (03/23/21 0845)   dextrose 5 % and 0.45 % NaCl with KCl 20 mEq/L 75 mL/hr at 03/23/21 [id number]     Assessment/Plan Abdominal pain with abnormal appendix POD #1 Laparoscopic appendectomy 03/22/2021 Dr. Stark Klein  -Plan: Discharge home today follow-up in the office in 2 to 3 weeks.  FEN: IV fluids/carb modified diet ID: Rocephin/Flagyl 9/12>> day 3 DVT: Lovenox ordered but not given so far Follow-up: [facility]  Type 2 diabetes - insulin dependent Diabetic neuropathy  - On chronic Norco 7.5/325 twice daily at home Thyroid disease     LOS: 1 day    Pamela Murray 03/23/2021 Please see Amion

## 2021-03-23 NOTE — Progress Notes (Signed)
Pamela Murray to be D/C'd per MD order. Discussed with the patient and all questions fully answered. ? VSS, Skin clean, dry and intact without evidence of skin break down, no evidence of skin tears noted. ? IV catheter discontinued intact. Site without signs and symptoms of complications. Dressing and pressure applied. ? An After Visit Summary was printed and given to the patient. Patient informed where to pickup prescriptions. ? D/c education completed with patient/family including follow up instructions, medication list, d/c activities limitations if indicated, with other d/c instructions as indicated by MD - patient able to verbalize understanding, all questions fully answered.  ? Patient instructed to return to ED, call 911, or call MD for any changes in condition.  ? Patient to be escorted via [place], and D/C home via private auto.

## 2021-03-23 NOTE — Discharge Summary (Signed)
Physician Discharge Summary  Patient ID: Lynley Zachman MRN: [id number] DOB/AGE: [birth year] 69 y.o.  Admit date: 03/21/2021 Discharge date: 03/23/2021  Admission Diagnoses:   Abdominal pain with possible appendicitis Type 2 diabetes - insulin dependent Diabetic neuropathy  - On chronic Norco 7.5/325 twice daily at home Thyroid disease  Discharge Diagnoses:  Abdominal pain with abnormal appendix Type 2 diabetes - insulin dependent Diabetic neuropathy  - On chronic Norco 7.5/325 twice daily at home Thyroid disease  Active Problems:   Abdominal pain   PROCEDURES: Laparoscopic appendectomy 03/22/2021 Dr. Sallye Lat Course:  [phone] F initially brought to the emergency department by EMS for chest pain.  She received nitroglycerin in route with improvement in symptoms.  She underwent a cardiac work-up including EKG and troponins which was unremarkable.  She then related to the emergency department staff that she has been having some abdominal pain.  CT scan of the abdomen pelvis was performed which showed possible mild appendicitis.  The findings were quite subtle.  She reports having generalized abdominal pain for about 4 weeks.  No constipation.  The pain is more in her lower abdomen than upper abdomen but is on both sides.  She describes getting bloating after she eats.  She has bowel movements which are very loose daily.  She reports this is her normal habit.  She notes that her appetite has been down some but she makes sure to eat due to her diabetes.  She was seen in the emergency department and admitted by Dr. Grandville Silos.  She was started on IV antibiotics, made n.p.o., and bowel rest.  The following a.m. she was seen by Dr. Barry Dienes with ongoing abdominal pain and possible appendicitis.  She taken the operating room and underwent laparoscopic appendectomy.  She tolerated the procedure well.  She returned to the floor in satisfactory condition.  She was mobilized, her diet was  advanced and pain was controlled with oral pain medications.  She was ready for discharge on the first postoperative day.  We resumed all her preadmission medicines as before.  She is also given plain Tylenol and oxycodone for additional pain control.  Condition on discharge: Improved.  CBC Latest Ref Rng & Units 03/22/2021 03/21/2021 05/30/2017  WBC 4.0 - 10.5 K/uL 10.7(H) 14.7(H) 6.0  Hemoglobin 12.0 - 15.0 g/dL 9.5(L) 10.6(L) 14.5  Hematocrit 36.0 - 46.0 % 32.8(L) 35.9(L) 42.6  Platelets 150 - 400 K/uL 240 302 187    CMP Latest Ref Rng & Units 03/22/2021 03/21/2021 05/30/2017  Glucose 70 - 99 mg/dL 124(H) 122(H) 300(H)  BUN 8 - 23 mg/dL 5(L) 12 16  Creatinine 0.44 - 1.00 mg/dL 0.97 0.99 1.20(H)  Sodium 135 - 145 mmol/L 138 139 132(L)  Potassium 3.5 - 5.1 mmol/L 4.0 4.4 4.3  Chloride 98 - 111 mmol/L 102 103 95(L)  CO2 22 - 32 mmol/L '28 23 27  '$ Calcium 8.9 - 10.3 mg/dL 9.2 9.2 9.8  Total Protein 6.5 - 8.1 g/dL - 6.2(L) 6.8  Total Bilirubin 0.3 - 1.2 mg/dL - 0.3 0.5  Alkaline Phos 38 - 126 U/L - 42 62  AST 15 - 41 U/L - 23 39  ALT 0 - 44 U/L - 18 45    CT scan 03/21/2021: The appendix is somewhat thickened compared to prior exam, currently measuring 9 mm in diameter. Possible minimal surrounding inflammatory changes may be present. These findings are concerning for possible appendicitis, and correlation with clinical and laboratory data is recommended.   Disposition: Discharge  disposition: 01-Home or Self Care        Allergies as of 03/23/2021       Reactions   Aspirin Nausea Only   Statins Other (See Comments)   Joint pain        Medication List     TAKE these medications    acetaminophen 325 MG tablet Commonly known as: TYLENOL Take 2 tablets (650 mg total) by mouth every 4 (four) hours as needed for mild pain, moderate pain, fever or headache (or temp > 100).   cholecalciferol 1000 units tablet Commonly known as: VITAMIN D Take 1,000 Units by mouth daily.    cyanocobalamin 1000 MCG/ML injection Commonly known as: (VITAMIN B-12) Inject 1,000 mcg into the muscle every 30 (thirty) days.   dicyclomine 10 MG capsule Commonly known as: BENTYL Take 10 mg by mouth 3 (three) times daily.   estradiol 1 MG tablet Commonly known as: ESTRACE Take 1 mg by mouth daily.   fexofenadine 180 MG tablet Commonly known as: ALLEGRA Take 180 mg by mouth daily.   furosemide 20 MG tablet Commonly known as: LASIX Take 20 mg by mouth.   HYDROcodone-acetaminophen 7.5-325 MG tablet Commonly known as: NORCO Take 1 tablet by mouth 2 (two) times daily.   insulin isophane & regular human KwikPen (70-30) 100 UNIT/ML KwikPen Commonly known as: HUMULIN 70/30 MIX Inject into the skin See admin instructions. Inject 40 units subcutaneously before breakfast, and inject 25 units subcutaneously before dinner.   levothyroxine 25 MCG tablet Commonly known as: SYNTHROID Take 25 mcg by mouth daily before breakfast.   lisinopril 5 MG tablet Commonly known as: ZESTRIL Take 5 mg by mouth daily.   meloxicam 7.5 MG tablet Commonly known as: MOBIC Take 7.5 mg by mouth daily.   metFORMIN 1000 MG (MOD) 24 hr tablet Commonly known as: GLUMETZA Take 1,000 mg by mouth 2 (two) times daily with a meal.   oxyCODONE 5 MG immediate release tablet Commonly known as: Oxy IR/ROXICODONE Take 1 tablet (5 mg total) by mouth every 6 (six) hours as needed for moderate pain (pain not relieved by other oral medicines).   PARoxetine 25 MG 24 hr tablet Commonly known as: PAXIL-CR Take 25 mg by mouth at bedtime.   RABEprazole 20 MG tablet Commonly known as: ACIPHEX Take 20 mg by mouth daily.   sitaGLIPtin 100 MG tablet Commonly known as: JANUVIA Take 100 mg by mouth daily.   sodium chloride 0.65 % Soln nasal spray Commonly known as: OCEAN Place 1 spray into both nostrils as needed for congestion.   Systane 0.4-0.3 % Soln Generic drug: Polyethyl Glycol-Propyl Glycol Place 2  drops into both eyes daily.        Follow-up Information     Koir[place], [place], MD Follow up.   Specialty: Family Medicine Why: Call if you have problems with your glucose or other medical issues.  Let them know you had surgery. Contact information: [address] [place] [address] [phone]         [facility], [place] Follow up on 04/12/2021.   Specialty: General Surgery Why: Your appointment is at 10:15 AM.  Be at the office 30 minutes early for check-in.  Bring photo ID and insurance information. Contact information: [place] [address] [phone]                 Signed: Earnstine Regal 03/23/2021, 10:32 AM

## 2021-03-23 NOTE — Discharge Instructions (Signed)
[facility] ______[facility] LAPAROSCOPIC SURGERY: POST OP INSTRUCTIONS Always review your discharge instruction sheet given to you by the facility where your surgery was performed. IF YOU HAVE DISABILITY OR FAMILY LEAVE FORMS, YOU MUST BRING THEM TO THE OFFICE FOR PROCESSING.   DO NOT GIVE THEM TO YOUR DOCTOR.  A prescription for pain medication may be given to you upon discharge.  Take your pain medication as prescribed, if needed.  If narcotic pain medicine is not needed, then you may take acetaminophen (Tylenol) or ibuprofen (Advil) as needed. Take your usually prescribed medications unless otherwise directed. If you need a refill on your pain medication, please contact your pharmacy.  They will contact our office to request authorization. Prescriptions will not be filled after 5pm or on week-ends. You should follow a light diet the first few days after arrival home, such as soup and crackers, etc.  Be sure to include lots of fluids daily. Most patients will experience some swelling and bruising in the area of the incisions.  Ice packs will help.  Swelling and bruising can take several days to resolve.  It is common to experience some constipation if taking pain medication after surgery.  Increasing fluid intake and taking a stool softener (such as Colace) will usually help or prevent this problem from occurring.  A mild laxative (Milk of Magnesia or Miralax) should be taken according to package instructions if there are no bowel movements after 48 hours. Unless discharge instructions indicate otherwise, you may remove your bandages 24-48 hours after surgery, and you may shower at that time.  You may have steri-strips (small skin tapes) in place directly over the incision.  These strips should be left on the skin for 7-10 days.  If your surgeon used skin glue on the incision, you may shower in 24 hours.  The glue will flake off over the next 2-3 weeks.  Any sutures or staples will be  removed at the office during your follow-up visit. ACTIVITIES:  You may resume regular (light) daily activities beginning the next day--such as daily self-care, walking, climbing stairs--gradually increasing activities as tolerated.  You may have sexual intercourse when it is comfortable.  Refrain from any heavy lifting or straining until approved by your doctor. You may drive when you are no longer taking prescription pain medication, you can comfortably wear a seatbelt, and you can safely maneuver your car and apply brakes. RETURN TO WORK:  __________________________________________________________ You should see your doctor in the office for a follow-up appointment approximately 2-3 weeks after your surgery.  Make sure that you call for this appointment within a day or two after you arrive home to insure a convenient appointment time. OTHER INSTRUCTIONS: __________________________________________________________________________________________________________________________ __________________________________________________________________________________________________________________________ WHEN TO CALL YOUR DOCTOR: Fever over 101.0 Inability to urinate Continued bleeding from incision. Increased pain, redness, or drainage from the incision. Increasing abdominal pain  The clinic staff is available to answer your questions during regular business hours.  Please don't hesitate to call and ask to speak to one of the nurses for clinical concerns.  If you have a medical emergency, go to the nearest emergency room or call 911.  A surgeon from [facility] is always on call at the hospital. [address], [address], [place], [place]  [address] ? [address] [address], [place], [place]   [address] [phone] ? [phone] ? FAX [phone] Web site: [email or web address]  

## 2021-03-23 NOTE — Plan of Care (Signed)
  Problem: Education: Goal: Required Educational Video(s) Outcome: Progressing

## 2021-03-26 LAB — SURGICAL PATHOLOGY

## 2021-03-31 DIAGNOSIS — K37 Unspecified appendicitis: Secondary | ICD-10-CM | POA: Diagnosis not present

## 2021-03-31 DIAGNOSIS — I10 Essential (primary) hypertension: Secondary | ICD-10-CM | POA: Diagnosis not present

## 2021-03-31 DIAGNOSIS — E611 Iron deficiency: Secondary | ICD-10-CM | POA: Diagnosis not present

## 2021-04-05 DIAGNOSIS — E538 Deficiency of other specified B group vitamins: Secondary | ICD-10-CM | POA: Diagnosis not present

## 2021-05-12 DIAGNOSIS — J0100 Acute maxillary sinusitis, unspecified: Secondary | ICD-10-CM | POA: Diagnosis not present

## 2021-05-12 DIAGNOSIS — Z20822 Contact with and (suspected) exposure to covid-19: Secondary | ICD-10-CM | POA: Diagnosis not present

## 2021-05-12 DIAGNOSIS — J069 Acute upper respiratory infection, unspecified: Secondary | ICD-10-CM | POA: Diagnosis not present

## 2021-05-12 DIAGNOSIS — J029 Acute pharyngitis, unspecified: Secondary | ICD-10-CM | POA: Diagnosis not present

## 2021-05-12 DIAGNOSIS — R059 Cough, unspecified: Secondary | ICD-10-CM | POA: Diagnosis not present

## 2021-05-16 DIAGNOSIS — D224 Melanocytic nevi of scalp and neck: Secondary | ICD-10-CM | POA: Diagnosis not present

## 2021-05-16 DIAGNOSIS — D2272 Melanocytic nevi of left lower limb, including hip: Secondary | ICD-10-CM | POA: Diagnosis not present

## 2021-05-16 DIAGNOSIS — D1801 Hemangioma of skin and subcutaneous tissue: Secondary | ICD-10-CM | POA: Diagnosis not present

## 2021-05-16 DIAGNOSIS — S40812A Abrasion of left upper arm, initial encounter: Secondary | ICD-10-CM | POA: Diagnosis not present

## 2021-05-16 DIAGNOSIS — D225 Melanocytic nevi of trunk: Secondary | ICD-10-CM | POA: Diagnosis not present

## 2021-05-16 DIAGNOSIS — L814 Other melanin hyperpigmentation: Secondary | ICD-10-CM | POA: Diagnosis not present

## 2021-05-16 DIAGNOSIS — L821 Other seborrheic keratosis: Secondary | ICD-10-CM | POA: Diagnosis not present

## 2021-06-06 DIAGNOSIS — E538 Deficiency of other specified B group vitamins: Secondary | ICD-10-CM | POA: Diagnosis not present

## 2021-06-21 ENCOUNTER — Encounter (HOSPITAL_BASED_OUTPATIENT_CLINIC_OR_DEPARTMENT_OTHER): Payer: Self-pay | Admitting: Emergency Medicine

## 2021-06-21 ENCOUNTER — Emergency Department (HOSPITAL_BASED_OUTPATIENT_CLINIC_OR_DEPARTMENT_OTHER)
Admission: EM | Admit: 2021-06-21 | Discharge: 2021-06-21 | Disposition: A | Discharge status: Home / Self Care | Payer: Medicare HMO | Attending: Emergency Medicine | Admitting: Emergency Medicine

## 2021-06-21 ENCOUNTER — Emergency Department (HOSPITAL_BASED_OUTPATIENT_CLINIC_OR_DEPARTMENT_OTHER): Payer: Medicare HMO

## 2021-06-21 ENCOUNTER — Other Ambulatory Visit: Payer: Self-pay

## 2021-06-21 DIAGNOSIS — Z7984 Long term (current) use of oral hypoglycemic drugs: Secondary | ICD-10-CM | POA: Insufficient documentation

## 2021-06-21 DIAGNOSIS — R059 Cough, unspecified: Secondary | ICD-10-CM | POA: Diagnosis not present

## 2021-06-21 DIAGNOSIS — J9811 Atelectasis: Secondary | ICD-10-CM | POA: Diagnosis not present

## 2021-06-21 DIAGNOSIS — J40 Bronchitis, not specified as acute or chronic: Secondary | ICD-10-CM | POA: Insufficient documentation

## 2021-06-21 DIAGNOSIS — Z794 Long term (current) use of insulin: Secondary | ICD-10-CM | POA: Insufficient documentation

## 2021-06-21 DIAGNOSIS — Z20822 Contact with and (suspected) exposure to covid-19: Secondary | ICD-10-CM | POA: Insufficient documentation

## 2021-06-21 DIAGNOSIS — E119 Type 2 diabetes mellitus without complications: Secondary | ICD-10-CM | POA: Insufficient documentation

## 2021-06-21 DIAGNOSIS — R0602 Shortness of breath: Secondary | ICD-10-CM | POA: Diagnosis not present

## 2021-06-21 LAB — RESP PANEL BY RT-PCR (FLU A&B, COVID) ARPGX2
Influenza A by PCR: NEGATIVE
Influenza B by PCR: NEGATIVE
SARS Coronavirus 2 by RT PCR: NEGATIVE

## 2021-06-21 MED ORDER — DEXAMETHASONE 4 MG PO TABS
10.0000 mg | ORAL_TABLET | Freq: Once | ORAL | Status: AC
  Administered 2021-06-21: 10 mg via ORAL
  Filled 2021-06-21: qty 3

## 2021-06-21 MED ORDER — AZITHROMYCIN 250 MG PO TABS: 250.0000 mg | ORAL_TABLET | Freq: Every day | ORAL | 0 refills | Status: AC

## 2021-06-21 NOTE — ED Notes (Signed)
RT Note: No COPD hx. or Meds per pt., Wheezes after laying down/getting up and coughing up some yellow mucus.

## 2021-06-21 NOTE — ED Triage Notes (Signed)
Reports cough for the last three weeks that's not getting better.  Reports feeling SOB at times.  No respiratory history.

## 2021-06-21 NOTE — ED Provider Notes (Addendum)
[facility] DEPT Provider Note   CSN: [id number] Arrival date & time: 06/21/21  1109     History Chief Complaint  Patient presents with   Cough    Pamela Murray is a 70 y.o. female.  The history is provided by the patient.  Cough Cough characteristics:  Non-productive Sputum characteristics:  Nondescript Severity:  Mild Onset quality:  Gradual Duration:  2 weeks Timing:  Intermittent Progression:  Waxing and waning Chronicity:  New Context: upper respiratory infection   Relieved by:  Nothing Worsened by:  Nothing Associated symptoms: no chest pain, no chills, no diaphoresis, no ear fullness, no ear pain, no eye discharge, no fever, no headaches, no myalgias, no rash, no rhinorrhea, no shortness of breath, no sinus congestion, no sore throat, no weight loss and no wheezing       Past Medical History:  Diagnosis Date   Diabetes mellitus without complication (HCC)    Diverticulosis    Neuropathy    Osteoarthritis    PONV (postoperative nausea and vomiting)    Thyroid disease     Patient Active Problem List   Diagnosis Date Noted   Abdominal pain 03/21/2021   Palpitation 05/30/2018   Pain in both lower extremities 05/30/2018    Past Surgical History:  Procedure Laterality Date   ABDOMINAL HYSTERECTOMY     CHOLECYSTECTOMY     HEMORRHOID SURGERY     LAPAROSCOPIC APPENDECTOMY N/A 03/22/2021   Procedure: APPENDECTOMY LAPAROSCOPIC;  Surgeon: Stark Klein, MD;  Location: [facility];  Service: General;  Laterality: N/A;     OB History   No obstetric history on file.     Family History  Problem Relation Age of Onset   Thyroid disease Mother    Ovarian cancer Mother    Diabetes Mother    Stroke Mother        Died age 78   Heart failure Mother    Diabetes Father    Heart disease Father        Died age 36   Breast cancer Sister    Breast cancer Sister    Atrial fibrillation Sister    Atrial fibrillation Sister    Clotting  disorder Sister     Social History   Tobacco Use   Smoking status: Never   Smokeless tobacco: Never  Substance Use Topics   Alcohol use: No    Home Medications Prior to Admission medications   Medication Sig Start Date End Date Taking? Authorizing Provider  azithromycin (ZITHROMAX) 250 MG tablet Take 1 tablet (250 mg total) by mouth daily. Take first 2 tablets together, then 1 every day until finished. 06/21/21  Yes Samarion Ehle, DO  acetaminophen (TYLENOL) 325 MG tablet Take 2 tablets (650 mg total) by mouth every 4 (four) hours as needed for mild pain, moderate pain, fever or headache (or temp > 100). 03/23/21   Earnstine Regal, PA-C  cholecalciferol (VITAMIN D) 1000 units tablet Take 1,000 Units by mouth daily.    [provider]  cyanocobalamin (,VITAMIN B-12,) 1000 MCG/ML injection Inject 1,000 mcg into the muscle every 30 (thirty) days. 07/13/16   [provider]  dicyclomine (BENTYL) 10 MG capsule Take 10 mg by mouth 3 (three) times daily.     [provider]  estradiol (ESTRACE) 1 MG tablet Take 1 mg by mouth daily.    [provider]  fexofenadine (ALLEGRA) 180 MG tablet Take 180 mg by mouth daily.    [provider]  furosemide (  LASIX) 20 MG tablet Take 20 mg by mouth.    [provider]  HYDROcodone-acetaminophen (NORCO) 7.5-325 MG tablet Take 1 tablet by mouth 2 (two) times daily.     [provider]  insulin isophane & regular human KwikPen (HUMULIN 70/30 MIX) (70-30) 100 UNIT/ML KwikPen Inject into the skin See admin instructions. Inject 40 units subcutaneously before breakfast, and inject 25 units subcutaneously before dinner.    [provider]  levothyroxine (SYNTHROID, LEVOTHROID) 25 MCG tablet Take 25 mcg by mouth daily before breakfast.    [provider]  lisinopril (PRINIVIL,ZESTRIL) 5 MG tablet Take 5 mg by mouth daily.    [provider]  meloxicam (MOBIC) 7.5 MG tablet Take  7.5 mg by mouth daily.    [provider]  metFORMIN (GLUMETZA) 1000 MG (MOD) 24 hr tablet Take 1,000 mg by mouth 2 (two) times daily with a meal.     [provider]  oxyCODONE (OXY IR/ROXICODONE) 5 MG immediate release tablet Take 1 tablet (5 mg total) by mouth every 6 (six) hours as needed for moderate pain (pain not relieved by other oral medicines). 03/23/21   Earnstine Regal, PA-C  PARoxetine (PAXIL-CR) 25 MG 24 hr tablet Take 25 mg by mouth at bedtime.     [provider]  Polyethyl Glycol-Propyl Glycol (SYSTANE) 0.4-0.3 % SOLN Place 2 drops into both eyes daily.    [provider]  RABEprazole (ACIPHEX) 20 MG tablet Take 20 mg by mouth daily.    [provider]  sitaGLIPtin (JANUVIA) 100 MG tablet Take 100 mg by mouth daily.    [provider]  sodium chloride (OCEAN) 0.65 % SOLN nasal spray Place 1 spray into both nostrils as needed for congestion.    [provider]    Allergies    Aspirin and Statins  Review of Systems   Review of Systems  Constitutional:  Negative for chills, diaphoresis, fever and weight loss.  HENT:  Negative for ear pain, rhinorrhea and sore throat.   Eyes:  Negative for pain, discharge and visual disturbance.  Respiratory:  Positive for cough. Negative for shortness of breath and wheezing.   Cardiovascular:  Negative for chest pain and palpitations.  Gastrointestinal:  Negative for abdominal pain and vomiting.  Genitourinary:  Negative for dysuria and hematuria.  Musculoskeletal:  Negative for arthralgias, back pain and myalgias.  Skin:  Negative for color change and rash.  Neurological:  Negative for seizures, syncope and headaches.  All other systems reviewed and are negative.  Physical Exam Updated Vital Signs BP 138/83 (BP Location: Right Arm)    Pulse 83    Temp 98.2 F (36.8 C) (Oral)    Resp 18    Ht 5\' 6"  (1.676 m)    Wt 91.2 kg    SpO2 96%    BMI 32.44 kg/m   Physical  Exam Vitals and nursing note reviewed.  Constitutional:      General: She is not in acute distress.    Appearance: She is well-developed. She is not ill-appearing.  HENT:     Head: Normocephalic and atraumatic.     Nose: Nose normal.     Mouth/Throat:     Mouth: Mucous membranes are moist.  Eyes:     Extraocular Movements: Extraocular movements intact.     Conjunctiva/sclera: Conjunctivae normal.     Pupils: Pupils are equal, round, and reactive to light.  Cardiovascular:     Rate and Rhythm: Normal rate and regular  rhythm.     Pulses: Normal pulses.     Heart sounds: Normal heart sounds. No murmur heard. Pulmonary:     Effort: Pulmonary effort is normal. No respiratory distress.     Breath sounds: Normal breath sounds.  Abdominal:     Palpations: Abdomen is soft.     Tenderness: There is no abdominal tenderness.  Musculoskeletal:        General: No swelling.     Cervical back: Normal range of motion and neck supple.  Skin:    General: Skin is warm and dry.     Capillary Refill: Capillary refill takes less than 2 seconds.  Neurological:     General: No focal deficit present.     Mental Status: She is alert.  Psychiatric:        Mood and Affect: Mood normal.    ED Results / Procedures / Treatments   Labs (all labs ordered are listed, but only abnormal results are displayed) Labs Reviewed  RESP PANEL BY RT-PCR (FLU A&B, COVID) [id number]    EKG EKG Interpretation  Date/Time:  Tuesday June 21 2021 11:47:03 EST Ventricular Rate:  71 PR Interval:  136 QRS Duration: 83 QT Interval:  400 QTC Calculation: 435 R Axis:   13 Text Interpretation: Sinus rhythm Low voltage, precordial leads Borderline T abnormalities, anterior leads Confirmed by Lennice Sites ([id number]) on 06/21/2021 12:09:16 PM  Radiology DG Chest Portable 1 View  Result Date: 06/21/2021 CLINICAL DATA:  Cough, congestion, finished treatment for sinus infection, not any better EXAM: PORTABLE CHEST 1 VIEW  COMPARISON:  Portable exam 1131 hours compared to 03/21/2021 FINDINGS: Upper normal heart size. Mediastinal contours and pulmonary vascularity normal. Moderate bronchitic changes with mild bibasilar atelectasis and decreased lung volumes. Remaining lungs clear. No definite infiltrate, pleural effusion, or pneumothorax. IMPRESSION: Moderate bronchitic changes with bibasilar atelectasis. Electronically Signed   By: Lavonia Dana M.D.   On: 06/21/2021 11:39    Procedures Procedures   Medications Ordered in ED Medications  dexamethasone (DECADRON) tablet 10 mg (has no administration in time range)    ED Course  I have reviewed the triage vital signs and the nursing notes.  Pertinent labs & imaging results that were available during my care of the patient were reviewed by me and considered in my medical decision making (see chart for details).    MDM Rules/Calculators/A&P                           Havery Moros Pamela Murray is here with cough for the last 2 weeks.  No significant medical history.  Chest x-ray consistent with bronchitis changes.  Clear breath sounds on exam.  Viral type symptoms started 2 weeks ago.  Will swab for COVID and flu.  Will treat with Decadron and Z-Pak.  Suspect that this is a postviral cough.  Overall she appears well.  No chest pain.  EKG shows sinus rhythm.  Vital signs are normal.  No respiratory distress.  Understands return precautions and discharged in ED in good condition.  Viral swab has been sent.  This chart was dictated using voice recognition software.  Despite best efforts to proofread,  errors can occur which can change the documentation meaning.   Final Clinical Impression(s) / ED Diagnoses Final diagnoses:  Bronchitis    Rx / DC Orders ED Discharge Orders          Ordered    azithromycin (ZITHROMAX) 250 MG tablet  Daily  06/21/21 [place], Aunisty Reali, DO 06/21/21 [place], [place], DO 06/21/21 1209

## 2021-06-21 NOTE — ED Notes (Signed)
Was treated for sinus infection  3 weeks ago , cough never went away has had yellow sputum states she wheezes at night , called her dr and they are not seeing pts at this time

## 2021-06-21 NOTE — Discharge Instructions (Signed)
Overall suspect you have bronchitis.  Take antibiotic as prescribed.  You have been given a long-acting steroid.  Follow-up your viral testing on your MyChart.

## 2021-06-24 DIAGNOSIS — E7800 Pure hypercholesterolemia, unspecified: Secondary | ICD-10-CM | POA: Diagnosis not present

## 2021-06-24 DIAGNOSIS — E1165 Type 2 diabetes mellitus with hyperglycemia: Secondary | ICD-10-CM | POA: Diagnosis not present

## 2021-06-24 DIAGNOSIS — I10 Essential (primary) hypertension: Secondary | ICD-10-CM | POA: Diagnosis not present

## 2021-06-24 DIAGNOSIS — E039 Hypothyroidism, unspecified: Secondary | ICD-10-CM | POA: Diagnosis not present

## 2021-07-06 DIAGNOSIS — E538 Deficiency of other specified B group vitamins: Secondary | ICD-10-CM | POA: Diagnosis not present

## 2021-07-20 DIAGNOSIS — M5030 Other cervical disc degeneration, unspecified cervical region: Secondary | ICD-10-CM | POA: Diagnosis not present

## 2021-07-20 DIAGNOSIS — E039 Hypothyroidism, unspecified: Secondary | ICD-10-CM | POA: Diagnosis not present

## 2021-07-20 DIAGNOSIS — E611 Iron deficiency: Secondary | ICD-10-CM | POA: Diagnosis not present

## 2021-07-20 DIAGNOSIS — E538 Deficiency of other specified B group vitamins: Secondary | ICD-10-CM | POA: Diagnosis not present

## 2021-07-20 DIAGNOSIS — E7800 Pure hypercholesterolemia, unspecified: Secondary | ICD-10-CM | POA: Diagnosis not present

## 2021-07-20 DIAGNOSIS — K219 Gastro-esophageal reflux disease without esophagitis: Secondary | ICD-10-CM | POA: Diagnosis not present

## 2021-07-20 DIAGNOSIS — Z0001 Encounter for general adult medical examination with abnormal findings: Secondary | ICD-10-CM | POA: Diagnosis not present

## 2021-07-20 DIAGNOSIS — E1169 Type 2 diabetes mellitus with other specified complication: Secondary | ICD-10-CM | POA: Diagnosis not present

## 2021-07-20 DIAGNOSIS — I10 Essential (primary) hypertension: Secondary | ICD-10-CM | POA: Diagnosis not present

## 2021-07-20 DIAGNOSIS — E2839 Other primary ovarian failure: Secondary | ICD-10-CM | POA: Diagnosis not present

## 2021-08-08 DIAGNOSIS — E538 Deficiency of other specified B group vitamins: Secondary | ICD-10-CM | POA: Diagnosis not present

## 2021-08-08 DIAGNOSIS — E7800 Pure hypercholesterolemia, unspecified: Secondary | ICD-10-CM | POA: Diagnosis not present

## 2021-08-08 DIAGNOSIS — E039 Hypothyroidism, unspecified: Secondary | ICD-10-CM | POA: Diagnosis not present

## 2021-08-08 DIAGNOSIS — I10 Essential (primary) hypertension: Secondary | ICD-10-CM | POA: Diagnosis not present

## 2021-08-08 DIAGNOSIS — E1169 Type 2 diabetes mellitus with other specified complication: Secondary | ICD-10-CM | POA: Diagnosis not present

## 2021-08-08 DIAGNOSIS — N1831 Chronic kidney disease, stage 3a: Secondary | ICD-10-CM | POA: Diagnosis not present

## 2021-08-08 DIAGNOSIS — K219 Gastro-esophageal reflux disease without esophagitis: Secondary | ICD-10-CM | POA: Diagnosis not present

## 2021-08-09 DIAGNOSIS — H1045 Other chronic allergic conjunctivitis: Secondary | ICD-10-CM | POA: Diagnosis not present

## 2021-09-02 DIAGNOSIS — J0141 Acute recurrent pansinusitis: Secondary | ICD-10-CM | POA: Diagnosis not present

## 2021-09-02 DIAGNOSIS — R058 Other specified cough: Secondary | ICD-10-CM | POA: Diagnosis not present

## 2021-09-02 DIAGNOSIS — R0981 Nasal congestion: Secondary | ICD-10-CM | POA: Diagnosis not present

## 2021-09-02 DIAGNOSIS — U071 COVID-19: Secondary | ICD-10-CM | POA: Diagnosis not present

## 2021-09-02 DIAGNOSIS — R6883 Chills (without fever): Secondary | ICD-10-CM | POA: Diagnosis not present

## 2021-09-13 DIAGNOSIS — E538 Deficiency of other specified B group vitamins: Secondary | ICD-10-CM | POA: Diagnosis not present

## 2021-09-13 DIAGNOSIS — S61412A Laceration without foreign body of left hand, initial encounter: Secondary | ICD-10-CM | POA: Diagnosis not present

## 2021-09-21 DIAGNOSIS — S61402D Unspecified open wound of left hand, subsequent encounter: Secondary | ICD-10-CM | POA: Diagnosis not present

## 2021-09-27 DIAGNOSIS — Z0100 Encounter for examination of eyes and vision without abnormal findings: Secondary | ICD-10-CM | POA: Diagnosis not present

## 2021-09-27 DIAGNOSIS — E119 Type 2 diabetes mellitus without complications: Secondary | ICD-10-CM | POA: Diagnosis not present

## 2021-10-06 DIAGNOSIS — I10 Essential (primary) hypertension: Secondary | ICD-10-CM | POA: Diagnosis not present

## 2021-10-06 DIAGNOSIS — N1831 Chronic kidney disease, stage 3a: Secondary | ICD-10-CM | POA: Diagnosis not present

## 2021-10-06 DIAGNOSIS — E039 Hypothyroidism, unspecified: Secondary | ICD-10-CM | POA: Diagnosis not present

## 2021-10-06 DIAGNOSIS — E1169 Type 2 diabetes mellitus with other specified complication: Secondary | ICD-10-CM | POA: Diagnosis not present

## 2021-10-13 DIAGNOSIS — E538 Deficiency of other specified B group vitamins: Secondary | ICD-10-CM | POA: Diagnosis not present

## 2021-10-31 DIAGNOSIS — E611 Iron deficiency: Secondary | ICD-10-CM | POA: Diagnosis not present

## 2021-11-04 DIAGNOSIS — N951 Menopausal and female climacteric states: Secondary | ICD-10-CM | POA: Diagnosis not present

## 2021-11-04 DIAGNOSIS — Z01411 Encounter for gynecological examination (general) (routine) with abnormal findings: Secondary | ICD-10-CM | POA: Diagnosis not present

## 2021-11-04 DIAGNOSIS — Z124 Encounter for screening for malignant neoplasm of cervix: Secondary | ICD-10-CM | POA: Diagnosis not present

## 2021-11-04 DIAGNOSIS — Z0142 Encounter for cervical smear to confirm findings of recent normal smear following initial abnormal smear: Secondary | ICD-10-CM | POA: Diagnosis not present

## 2021-11-04 DIAGNOSIS — Z1231 Encounter for screening mammogram for malignant neoplasm of breast: Secondary | ICD-10-CM | POA: Diagnosis not present

## 2021-11-04 DIAGNOSIS — N959 Unspecified menopausal and perimenopausal disorder: Secondary | ICD-10-CM | POA: Diagnosis not present

## 2021-11-04 DIAGNOSIS — Z01419 Encounter for gynecological examination (general) (routine) without abnormal findings: Secondary | ICD-10-CM | POA: Diagnosis not present

## 2021-11-04 DIAGNOSIS — E669 Obesity, unspecified: Secondary | ICD-10-CM | POA: Diagnosis not present

## 2021-11-10 DIAGNOSIS — E538 Deficiency of other specified B group vitamins: Secondary | ICD-10-CM | POA: Diagnosis not present

## 2021-12-08 DIAGNOSIS — E538 Deficiency of other specified B group vitamins: Secondary | ICD-10-CM | POA: Diagnosis not present

## 2021-12-23 DIAGNOSIS — E039 Hypothyroidism, unspecified: Secondary | ICD-10-CM | POA: Diagnosis not present

## 2021-12-23 DIAGNOSIS — E538 Deficiency of other specified B group vitamins: Secondary | ICD-10-CM | POA: Diagnosis not present

## 2021-12-23 DIAGNOSIS — E1165 Type 2 diabetes mellitus with hyperglycemia: Secondary | ICD-10-CM | POA: Diagnosis not present

## 2021-12-23 DIAGNOSIS — E7800 Pure hypercholesterolemia, unspecified: Secondary | ICD-10-CM | POA: Diagnosis not present

## 2021-12-26 DIAGNOSIS — E039 Hypothyroidism, unspecified: Secondary | ICD-10-CM | POA: Diagnosis not present

## 2021-12-26 DIAGNOSIS — I10 Essential (primary) hypertension: Secondary | ICD-10-CM | POA: Diagnosis not present

## 2021-12-26 DIAGNOSIS — E1169 Type 2 diabetes mellitus with other specified complication: Secondary | ICD-10-CM | POA: Diagnosis not present

## 2021-12-26 DIAGNOSIS — E7800 Pure hypercholesterolemia, unspecified: Secondary | ICD-10-CM | POA: Diagnosis not present

## 2021-12-28 ENCOUNTER — Emergency Department (HOSPITAL_BASED_OUTPATIENT_CLINIC_OR_DEPARTMENT_OTHER): Payer: Medicare HMO | Admitting: Radiology

## 2021-12-28 ENCOUNTER — Other Ambulatory Visit: Payer: Self-pay

## 2021-12-28 ENCOUNTER — Emergency Department (HOSPITAL_BASED_OUTPATIENT_CLINIC_OR_DEPARTMENT_OTHER)
Admission: EM | Admit: 2021-12-28 | Discharge: 2021-12-28 | Disposition: A | Discharge status: Home / Self Care | Payer: Medicare HMO | Attending: Emergency Medicine | Admitting: Emergency Medicine

## 2021-12-28 ENCOUNTER — Emergency Department (HOSPITAL_BASED_OUTPATIENT_CLINIC_OR_DEPARTMENT_OTHER): Payer: Medicare HMO

## 2021-12-28 ENCOUNTER — Encounter (HOSPITAL_BASED_OUTPATIENT_CLINIC_OR_DEPARTMENT_OTHER): Payer: Self-pay

## 2021-12-28 DIAGNOSIS — Z794 Long term (current) use of insulin: Secondary | ICD-10-CM | POA: Diagnosis not present

## 2021-12-28 DIAGNOSIS — R519 Headache, unspecified: Secondary | ICD-10-CM | POA: Diagnosis not present

## 2021-12-28 DIAGNOSIS — S46912A Strain of unspecified muscle, fascia and tendon at shoulder and upper arm level, left arm, initial encounter: Secondary | ICD-10-CM | POA: Diagnosis not present

## 2021-12-28 DIAGNOSIS — M79641 Pain in right hand: Secondary | ICD-10-CM

## 2021-12-28 DIAGNOSIS — M542 Cervicalgia: Secondary | ICD-10-CM | POA: Diagnosis not present

## 2021-12-28 DIAGNOSIS — S0083XA Contusion of other part of head, initial encounter: Secondary | ICD-10-CM | POA: Diagnosis not present

## 2021-12-28 DIAGNOSIS — S60221A Contusion of right hand, initial encounter: Secondary | ICD-10-CM | POA: Insufficient documentation

## 2021-12-28 DIAGNOSIS — S0990XA Unspecified injury of head, initial encounter: Secondary | ICD-10-CM | POA: Insufficient documentation

## 2021-12-28 DIAGNOSIS — S8002XA Contusion of left knee, initial encounter: Secondary | ICD-10-CM | POA: Diagnosis not present

## 2021-12-28 DIAGNOSIS — Z7984 Long term (current) use of oral hypoglycemic drugs: Secondary | ICD-10-CM | POA: Diagnosis not present

## 2021-12-28 DIAGNOSIS — E119 Type 2 diabetes mellitus without complications: Secondary | ICD-10-CM | POA: Diagnosis not present

## 2021-12-28 DIAGNOSIS — M1811 Unilateral primary osteoarthritis of first carpometacarpal joint, right hand: Secondary | ICD-10-CM | POA: Diagnosis not present

## 2021-12-28 DIAGNOSIS — Y92009 Unspecified place in unspecified non-institutional (private) residence as the place of occurrence of the external cause: Secondary | ICD-10-CM | POA: Insufficient documentation

## 2021-12-28 DIAGNOSIS — W01198A Fall on same level from slipping, tripping and stumbling with subsequent striking against other object, initial encounter: Secondary | ICD-10-CM | POA: Diagnosis not present

## 2021-12-28 DIAGNOSIS — W19XXXA Unspecified fall, initial encounter: Secondary | ICD-10-CM

## 2021-12-28 DIAGNOSIS — M19012 Primary osteoarthritis, left shoulder: Secondary | ICD-10-CM | POA: Diagnosis not present

## 2021-12-28 DIAGNOSIS — M1712 Unilateral primary osteoarthritis, left knee: Secondary | ICD-10-CM | POA: Diagnosis not present

## 2021-12-28 DIAGNOSIS — S4992XA Unspecified injury of left shoulder and upper arm, initial encounter: Secondary | ICD-10-CM | POA: Diagnosis present

## 2021-12-28 LAB — CBG MONITORING, ED: Glucose-Capillary: 138 mg/dL — ABNORMAL HIGH (ref 70–99)

## 2021-12-28 NOTE — ED Notes (Signed)
ED Provider at bedside. 

## 2021-12-28 NOTE — ED Notes (Addendum)
Late entry -- Pt calling out requesting to use bathroom.  Entered room to find pt awake and alert- GCS 15.  C-Collar remains securely in place. RR even and unlabored on RA with symmetrical rise and fall of chest.  Pt ambulatory to bathroom with 1 person min assist for safety; gait steady; no obvious acute changes noted.

## 2021-12-28 NOTE — ED Triage Notes (Addendum)
Patient here POV from Home.  Patient endorses Falling onto Floor into the House after Slipping on a Rug and hitting Left Lateral Face/Orbit onto [facility] Floor. Occurred at Approximately 0900 this AM. Also endorses Soreness to Right Palm, Left Knee, Left Shoulder, and Left Torso.  No Anticoagulants. No LOC. C-Collar Applied as Patient endorses Mild Tenderness to Posterior Neck.  NAD Noted during Triage. A&Ox4. GCS 15. Ambulatory.

## 2021-12-28 NOTE — ED Notes (Signed)
Patient transported to CT via stretcher escorted by Radiology tech ; pt remains awake and alert- no acute changes noted.

## 2021-12-28 NOTE — ED Provider Notes (Signed)
[facility] DEPT Provider Note   CSN: [id number] Arrival date & time: 12/28/21  1615     History  Chief Complaint  Patient presents with   Ortencia Kick Yolandra Habig is a 71 y.o. female.  Patient status post fall this morning at 930.  [place] trying to get back into her front door.  Landed on her left side of her face and head.  No loss of consciousness.  Complaining of bruising and swelling to the left knee some discomfort to the left shoulder and bruising and swelling to the right hand.  No wrist pain on the right hand.  Patient is not on blood thinners.  No visual changes.  No nausea no vomiting.  Patient also complaining of neck pain.  No upper back or lower back pain.  Patient has been ambulatory.  No pain in the hips.  Past medical history significant for thyroid disease diabetes diverticulosis.  Has abdominal hysterectomy and gallbladder removed.  Patient is not a smoker       Home Medications Prior to Admission medications   Medication Sig Start Date End Date Taking? Authorizing Provider  acetaminophen (TYLENOL) 325 MG tablet Take 2 tablets (650 mg total) by mouth every 4 (four) hours as needed for mild pain, moderate pain, fever or headache (or temp > 100). 03/23/21   Earnstine Regal, PA-C  azithromycin (ZITHROMAX) 250 MG tablet Take 1 tablet (250 mg total) by mouth daily. Take first 2 tablets together, then 1 every day until finished. 06/21/21   Curatolo, Adam, DO  cholecalciferol (VITAMIN D) 1000 units tablet Take 1,000 Units by mouth daily.    [provider]  cyanocobalamin (,VITAMIN B-12,) 1000 MCG/ML injection Inject 1,000 mcg into the muscle every 30 (thirty) days. 07/13/16   [provider]  dicyclomine (BENTYL) 10 MG capsule Take 10 mg by mouth 3 (three) times daily.     [provider]  estradiol (ESTRACE) 1 MG tablet Take 1 mg by mouth daily.    [provider]  fexofenadine (ALLEGRA) 180 MG tablet Take 180  mg by mouth daily.    [provider]  furosemide (LASIX) 20 MG tablet Take 20 mg by mouth.    [provider]  HYDROcodone-acetaminophen (NORCO) 7.5-325 MG tablet Take 1 tablet by mouth 2 (two) times daily.     [provider]  insulin isophane & regular human KwikPen (HUMULIN 70/30 MIX) (70-30) 100 UNIT/ML KwikPen Inject into the skin See admin instructions. Inject 40 units subcutaneously before breakfast, and inject 25 units subcutaneously before dinner.    [provider]  levothyroxine (SYNTHROID, LEVOTHROID) 25 MCG tablet Take 25 mcg by mouth daily before breakfast.    [provider]  lisinopril (PRINIVIL,ZESTRIL) 5 MG tablet Take 5 mg by mouth daily.    [provider]  meloxicam (MOBIC) 7.5 MG tablet Take 7.5 mg by mouth daily.    [provider]  metFORMIN (GLUMETZA) 1000 MG (MOD) 24 hr tablet Take 1,000 mg by mouth 2 (two) times daily with a meal.     [provider]  oxyCODONE (OXY IR/ROXICODONE) 5 MG immediate release tablet Take 1 tablet (5 mg total) by mouth every 6 (six) hours as needed for moderate pain (pain not relieved by other oral medicines). 03/23/21   Earnstine Regal, PA-C  PARoxetine (PAXIL-CR) 25 MG 24 hr tablet Take 25 mg by mouth at bedtime.     [provider]  Polyethyl Glycol-Propyl Glycol (SYSTANE) 0.4-0.3 %  SOLN Place 2 drops into both eyes daily.    [provider]  RABEprazole (ACIPHEX) 20 MG tablet Take 20 mg by mouth daily.    [provider]  sitaGLIPtin (JANUVIA) 100 MG tablet Take 100 mg by mouth daily.    [provider]  sodium chloride (OCEAN) 0.65 % SOLN nasal spray Place 1 spray into both nostrils as needed for congestion.    [provider]      Allergies    Aspirin, Keflex [cephalexin], and Statins    Review of Systems   Review of Systems  Constitutional:  Negative for chills and fever.  HENT:  Positive for facial swelling. Negative  for ear pain and sore throat.   Eyes:  Negative for pain and visual disturbance.  Respiratory:  Negative for cough and shortness of breath.   Cardiovascular:  Negative for chest pain and palpitations.  Gastrointestinal:  Negative for abdominal pain, nausea and vomiting.  Genitourinary:  Negative for dysuria and hematuria.  Musculoskeletal:  Positive for joint swelling and neck pain. Negative for arthralgias and back pain.  Skin:  Negative for color change and rash.  Neurological:  Positive for headaches. Negative for seizures, syncope, speech difficulty, weakness and numbness.  Hematological:  Does not bruise/bleed easily.  Psychiatric/Behavioral:  Negative for confusion.   All other systems reviewed and are negative.   Physical Exam Updated Vital Signs BP (!) 164/82   Pulse 60   Temp (!) 96.7 F (35.9 C) (Temporal)   Resp 17   Ht 1.676 m ('5\' 6"'$ )   Wt 91.2 kg   SpO2 96%   BMI 32.45 kg/m  Physical Exam Vitals and nursing note reviewed.  Constitutional:      General: She is not in acute distress.    Appearance: She is well-developed.  HENT:     Head: Normocephalic.     Comments: Some bruising and superficial abrasion left lateral eye area and into the forehead.  A little bit of black eye underneath the left eye.  Extract muscles intact.  No evidence of any entrapment.    Mouth/Throat:     Mouth: Mucous membranes are moist.  Eyes:     Extraocular Movements: Extraocular movements intact.     Conjunctiva/sclera: Conjunctivae normal.     Pupils: Pupils are equal, round, and reactive to light.  Neck:     Comments: Mild tenderness palpation posterior cervical spine area. Cardiovascular:     Rate and Rhythm: Normal rate and regular rhythm.     Heart sounds: No murmur heard. Pulmonary:     Effort: Pulmonary effort is normal. No respiratory distress.     Breath sounds: Normal breath sounds.  Abdominal:     Palpations: Abdomen is soft.     Tenderness: There is no abdominal  tenderness.  Musculoskeletal:        General: Swelling and tenderness present.     Cervical back: Normal range of motion and neck supple. Tenderness present.     Comments: Some bruising and swelling to the left knee but no evidence of effusion.  Some pain with range of motion of left upper extremity of the shoulder but no deformity.  Radial pulses 2+ distally.  And some bruising and swelling to the palm of the right hand.  No snuffbox tenderness.  Radial pulse there is 2+.  Good cap refill sensation intact to both upper extremities.  Skin:    General: Skin is warm and dry.     Capillary Refill: Capillary  refill takes less than 2 seconds.  Neurological:     General: No focal deficit present.     Mental Status: She is alert and oriented to person, place, and time.     Cranial Nerves: No cranial nerve deficit.     Sensory: No sensory deficit.     Motor: No weakness.  Psychiatric:        Mood and Affect: Mood normal.     ED Results / Procedures / Treatments   Labs (all labs ordered are listed, but only abnormal results are displayed) Labs Reviewed  CBG MONITORING, ED - Abnormal; Notable for the following components:      Result Value   Glucose-Capillary 138 (*)    All other components within normal limits    EKG None  Radiology CT Head Wo Contrast  Result Date: 12/28/2021 CLINICAL DATA:  Recent fall with headaches and neck pain, initial encounter EXAM: CT HEAD WITHOUT CONTRAST CT MAXILLOFACIAL WITHOUT CONTRAST CT CERVICAL SPINE WITHOUT CONTRAST TECHNIQUE: Multidetector CT imaging of the head, cervical spine, and maxillofacial structures were performed using the standard protocol without intravenous contrast. Multiplanar CT image reconstructions of the cervical spine and maxillofacial structures were also generated. RADIATION DOSE REDUCTION: This exam was performed according to the departmental dose-optimization program which includes automated exposure control, adjustment of the mA  and/or kV according to patient size and/or use of iterative reconstruction technique. COMPARISON:  None Available. FINDINGS: CT HEAD FINDINGS Brain: No acute hemorrhage, acute infarction or space-occupying mass lesion is noted. Prominent CSF spaces are seen in a symmetrical fashion bilaterally likely related to subdural hygromas and underlying atrophy. Vascular: No hyperdense vessel or unexpected calcification. Skull: Normal. Negative for fracture or focal lesion. Other: None. CT MAXILLOFACIAL FINDINGS Osseous: No fracture or mandibular dislocation. No destructive process. Orbits: Orbits and their contents are within normal limits. Sinuses: Paranasal sinuses show no acute abnormality. Soft tissues: Surrounding soft tissue structures show no hematoma. CT CERVICAL SPINE FINDINGS Alignment: Within normal limits. Skull base and vertebrae: 7 cervical segments are well visualized. Vertebral body height is well maintained. Facet hypertrophic changes and osteophytic changes are seen. No acute fracture or acute facet abnormality is noted. Soft tissues and spinal canal: Surrounding soft tissue structures are within normal limits. Upper chest: Visualized lung apices are unremarkable. Other: None IMPRESSION: CT of the head: No acute intracranial abnormality is noted. Small anterior subdural hygromas are noted. CT of the maxillofacial bones: No acute bony abnormality is noted. No soft tissue changes are seen. CT of the cervical spine: Multilevel degenerative change without acute abnormality. Electronically Signed   By: Inez Catalina M.D.   On: 12/28/2021 20:50   CT Maxillofacial WO CM  Result Date: 12/28/2021 CLINICAL DATA:  Recent fall with headaches and neck pain, initial encounter EXAM: CT HEAD WITHOUT CONTRAST CT MAXILLOFACIAL WITHOUT CONTRAST CT CERVICAL SPINE WITHOUT CONTRAST TECHNIQUE: Multidetector CT imaging of the head, cervical spine, and maxillofacial structures were performed using the standard protocol without  intravenous contrast. Multiplanar CT image reconstructions of the cervical spine and maxillofacial structures were also generated. RADIATION DOSE REDUCTION: This exam was performed according to the departmental dose-optimization program which includes automated exposure control, adjustment of the mA and/or kV according to patient size and/or use of iterative reconstruction technique. COMPARISON:  None Available. FINDINGS: CT HEAD FINDINGS Brain: No acute hemorrhage, acute infarction or space-occupying mass lesion is noted. Prominent CSF spaces are seen in a symmetrical fashion bilaterally likely related to subdural hygromas and underlying atrophy. Vascular:  No hyperdense vessel or unexpected calcification. Skull: Normal. Negative for fracture or focal lesion. Other: None. CT MAXILLOFACIAL FINDINGS Osseous: No fracture or mandibular dislocation. No destructive process. Orbits: Orbits and their contents are within normal limits. Sinuses: Paranasal sinuses show no acute abnormality. Soft tissues: Surrounding soft tissue structures show no hematoma. CT CERVICAL SPINE FINDINGS Alignment: Within normal limits. Skull base and vertebrae: 7 cervical segments are well visualized. Vertebral body height is well maintained. Facet hypertrophic changes and osteophytic changes are seen. No acute fracture or acute facet abnormality is noted. Soft tissues and spinal canal: Surrounding soft tissue structures are within normal limits. Upper chest: Visualized lung apices are unremarkable. Other: None IMPRESSION: CT of the head: No acute intracranial abnormality is noted. Small anterior subdural hygromas are noted. CT of the maxillofacial bones: No acute bony abnormality is noted. No soft tissue changes are seen. CT of the cervical spine: Multilevel degenerative change without acute abnormality. Electronically Signed   By: Inez Catalina M.D.   On: 12/28/2021 20:50   CT Cervical Spine Wo Contrast  Result Date: 12/28/2021 CLINICAL DATA:   Recent fall with headaches and neck pain, initial encounter EXAM: CT HEAD WITHOUT CONTRAST CT MAXILLOFACIAL WITHOUT CONTRAST CT CERVICAL SPINE WITHOUT CONTRAST TECHNIQUE: Multidetector CT imaging of the head, cervical spine, and maxillofacial structures were performed using the standard protocol without intravenous contrast. Multiplanar CT image reconstructions of the cervical spine and maxillofacial structures were also generated. RADIATION DOSE REDUCTION: This exam was performed according to the departmental dose-optimization program which includes automated exposure control, adjustment of the mA and/or kV according to patient size and/or use of iterative reconstruction technique. COMPARISON:  None Available. FINDINGS: CT HEAD FINDINGS Brain: No acute hemorrhage, acute infarction or space-occupying mass lesion is noted. Prominent CSF spaces are seen in a symmetrical fashion bilaterally likely related to subdural hygromas and underlying atrophy. Vascular: No hyperdense vessel or unexpected calcification. Skull: Normal. Negative for fracture or focal lesion. Other: None. CT MAXILLOFACIAL FINDINGS Osseous: No fracture or mandibular dislocation. No destructive process. Orbits: Orbits and their contents are within normal limits. Sinuses: Paranasal sinuses show no acute abnormality. Soft tissues: Surrounding soft tissue structures show no hematoma. CT CERVICAL SPINE FINDINGS Alignment: Within normal limits. Skull base and vertebrae: 7 cervical segments are well visualized. Vertebral body height is well maintained. Facet hypertrophic changes and osteophytic changes are seen. No acute fracture or acute facet abnormality is noted. Soft tissues and spinal canal: Surrounding soft tissue structures are within normal limits. Upper chest: Visualized lung apices are unremarkable. Other: None IMPRESSION: CT of the head: No acute intracranial abnormality is noted. Small anterior subdural hygromas are noted. CT of the  maxillofacial bones: No acute bony abnormality is noted. No soft tissue changes are seen. CT of the cervical spine: Multilevel degenerative change without acute abnormality. Electronically Signed   By: Inez Catalina M.D.   On: 12/28/2021 20:50   DG Knee Complete 4 Views Left  Result Date: 12/28/2021 CLINICAL DATA:  Fall EXAM: LEFT KNEE - COMPLETE 4+ VIEW COMPARISON:  None Available. FINDINGS: No fracture or malalignment. No sizable knee effusion. 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 IMPRESSION: No acute osseous abnormality Electronically Signed   By: Donavan Foil M.D.   On: 12/28/2021 17:34   DG Hand Complete Right  Result Date: 12/28/2021 CLINICAL DATA:  Fall EXAM: RIGHT HAND - COMPLETE 3+ VIEW COMPARISON:  None Available. FINDINGS: No acute fracture or malalignment. Advanced arthritis at the first [facility] joint with moderate arthritis  at the STT interval. IMPRESSION: No acute osseous abnormality Electronically Signed   By: Donavan Foil M.D.   On: 12/28/2021 17:33   DG Shoulder Left  Result Date: 12/28/2021 CLINICAL DATA:  Fall EXAM: LEFT SHOULDER - 2+ VIEW COMPARISON:  None Available. FINDINGS: No fracture or malalignment.  Mild AC joint degenerative change IMPRESSION: No acute osseous abnormality Electronically Signed   By: Donavan Foil M.D.   On: 12/28/2021 17:31    Procedures Procedures    Medications Ordered in ED Medications - No data to display  ED Course/ Medical Decision Making/ A&P                           Medical Decision Making Amount and/or Complexity of Data Reviewed Radiology: ordered.   Patient has CT head face and neck pending.  It was good at 138 x-rays of the left knee without any bony abnormalities x-ray of the right hand without any bony abnormalities.  X-ray of the left shoulder without dislocation or bony abnormalities.  Disposition be based on CT head neck and face.  CT head neck and face without any acute abnormalities.   Patient stable for discharge home. Final Clinical Impression(s) / ED Diagnoses Final diagnoses:  Fall, initial encounter  Injury of head, initial encounter  Contusion of left knee, initial encounter  Strain of left shoulder, initial encounter  Hand pain, right    Rx / DC Orders ED Discharge Orders     None         Fredia Sorrow, MD 12/28/21 2135

## 2021-12-28 NOTE — ED Notes (Signed)
Pt agreeable with d/c plan as discussed by provider- this nurse has verbally reinforced d/c instructions and provided pt with written copy - pt acknowledges verbal understanding and denies any additional questions, concerns, needs- ambulatory at d/c independently with steady gait; no acute distress; vitals within baseline

## 2021-12-28 NOTE — Discharge Instructions (Addendum)
CT head neck face without any acute abnormalities.  X-ray of left shoulder left knee without any bony abnormalities.  X-ray of right hand without any bony abnormalities.  Follow-up with your primary care doctor or can follow-up with sports medicine if things do not improve over the next several days.  Expect to be sore and stiff in those areas.

## 2021-12-28 NOTE — ED Notes (Signed)
Adjusted neck brace with 2nd person and checked cbg

## 2021-12-30 DIAGNOSIS — E7800 Pure hypercholesterolemia, unspecified: Secondary | ICD-10-CM | POA: Diagnosis not present

## 2021-12-30 DIAGNOSIS — E039 Hypothyroidism, unspecified: Secondary | ICD-10-CM | POA: Diagnosis not present

## 2021-12-30 DIAGNOSIS — I10 Essential (primary) hypertension: Secondary | ICD-10-CM | POA: Diagnosis not present

## 2021-12-30 DIAGNOSIS — E1165 Type 2 diabetes mellitus with hyperglycemia: Secondary | ICD-10-CM | POA: Diagnosis not present

## 2022-01-11 DIAGNOSIS — E538 Deficiency of other specified B group vitamins: Secondary | ICD-10-CM | POA: Diagnosis not present

## 2022-01-18 DIAGNOSIS — E1122 Type 2 diabetes mellitus with diabetic chronic kidney disease: Secondary | ICD-10-CM | POA: Diagnosis not present

## 2022-01-18 DIAGNOSIS — E7800 Pure hypercholesterolemia, unspecified: Secondary | ICD-10-CM | POA: Diagnosis not present

## 2022-01-18 DIAGNOSIS — G894 Chronic pain syndrome: Secondary | ICD-10-CM | POA: Diagnosis not present

## 2022-01-18 DIAGNOSIS — F411 Generalized anxiety disorder: Secondary | ICD-10-CM | POA: Diagnosis not present

## 2022-01-18 DIAGNOSIS — I10 Essential (primary) hypertension: Secondary | ICD-10-CM | POA: Diagnosis not present

## 2022-01-18 DIAGNOSIS — E538 Deficiency of other specified B group vitamins: Secondary | ICD-10-CM | POA: Diagnosis not present

## 2022-01-18 DIAGNOSIS — N1831 Chronic kidney disease, stage 3a: Secondary | ICD-10-CM | POA: Diagnosis not present

## 2022-01-18 DIAGNOSIS — M5030 Other cervical disc degeneration, unspecified cervical region: Secondary | ICD-10-CM | POA: Diagnosis not present

## 2022-01-18 DIAGNOSIS — Z79899 Other long term (current) drug therapy: Secondary | ICD-10-CM | POA: Diagnosis not present

## 2022-01-18 DIAGNOSIS — E039 Hypothyroidism, unspecified: Secondary | ICD-10-CM | POA: Diagnosis not present

## 2022-01-20 ENCOUNTER — Other Ambulatory Visit: Payer: Self-pay | Admitting: Family Medicine

## 2022-01-20 DIAGNOSIS — G9608 Other cranial cerebrospinal fluid leak: Secondary | ICD-10-CM

## 2022-02-01 ENCOUNTER — Ambulatory Visit
Admission: RE | Admit: 2022-02-01 | Discharge: 2022-02-01 | Disposition: A | Discharge status: Home / Self Care | Payer: Medicare HMO | Source: Ambulatory Visit | Attending: Family Medicine | Admitting: Family Medicine

## 2022-02-01 DIAGNOSIS — I6200 Nontraumatic subdural hemorrhage, unspecified: Secondary | ICD-10-CM | POA: Diagnosis not present

## 2022-02-01 DIAGNOSIS — G9608 Other cranial cerebrospinal fluid leak: Secondary | ICD-10-CM

## 2022-02-01 DIAGNOSIS — Z86018 Personal history of other benign neoplasm: Secondary | ICD-10-CM | POA: Diagnosis not present

## 2022-02-01 DIAGNOSIS — J320 Chronic maxillary sinusitis: Secondary | ICD-10-CM | POA: Diagnosis not present

## 2022-02-01 DIAGNOSIS — I6782 Cerebral ischemia: Secondary | ICD-10-CM | POA: Diagnosis not present

## 2022-02-01 MED ORDER — GADOBENATE DIMEGLUMINE 529 MG/ML IV SOLN
19.0000 mL | Freq: Once | INTRAVENOUS | Status: AC | PRN
  Administered 2022-02-01: 19 mL via INTRAVENOUS

## 2022-02-08 ENCOUNTER — Other Ambulatory Visit: Payer: Self-pay | Admitting: Family Medicine

## 2022-02-08 DIAGNOSIS — Z8673 Personal history of transient ischemic attack (TIA), and cerebral infarction without residual deficits: Secondary | ICD-10-CM

## 2022-02-22 ENCOUNTER — Ambulatory Visit
Admission: RE | Admit: 2022-02-22 | Discharge: 2022-02-22 | Disposition: A | Discharge status: Home / Self Care | Payer: Medicare HMO | Source: Ambulatory Visit | Attending: Family Medicine | Admitting: Family Medicine

## 2022-02-22 DIAGNOSIS — E119 Type 2 diabetes mellitus without complications: Secondary | ICD-10-CM | POA: Diagnosis not present

## 2022-02-22 DIAGNOSIS — Z8673 Personal history of transient ischemic attack (TIA), and cerebral infarction without residual deficits: Secondary | ICD-10-CM | POA: Diagnosis not present

## 2022-02-22 DIAGNOSIS — I6521 Occlusion and stenosis of right carotid artery: Secondary | ICD-10-CM | POA: Diagnosis not present

## 2022-02-22 DIAGNOSIS — E782 Mixed hyperlipidemia: Secondary | ICD-10-CM | POA: Diagnosis not present

## 2022-02-23 DIAGNOSIS — E538 Deficiency of other specified B group vitamins: Secondary | ICD-10-CM | POA: Diagnosis not present

## 2022-03-14 DIAGNOSIS — Z03818 Encounter for observation for suspected exposure to other biological agents ruled out: Secondary | ICD-10-CM | POA: Diagnosis not present

## 2022-03-14 DIAGNOSIS — B349 Viral infection, unspecified: Secondary | ICD-10-CM | POA: Diagnosis not present

## 2022-03-14 DIAGNOSIS — R0982 Postnasal drip: Secondary | ICD-10-CM | POA: Diagnosis not present

## 2022-03-14 DIAGNOSIS — R0981 Nasal congestion: Secondary | ICD-10-CM | POA: Diagnosis not present

## 2022-03-14 DIAGNOSIS — J029 Acute pharyngitis, unspecified: Secondary | ICD-10-CM | POA: Diagnosis not present

## 2022-04-03 DIAGNOSIS — R109 Unspecified abdominal pain: Secondary | ICD-10-CM | POA: Diagnosis not present

## 2022-04-03 DIAGNOSIS — R195 Other fecal abnormalities: Secondary | ICD-10-CM | POA: Diagnosis not present

## 2022-04-03 DIAGNOSIS — R1310 Dysphagia, unspecified: Secondary | ICD-10-CM | POA: Diagnosis not present

## 2022-04-03 DIAGNOSIS — Z86010 Personal history of colonic polyps: Secondary | ICD-10-CM | POA: Diagnosis not present

## 2022-04-27 DIAGNOSIS — E538 Deficiency of other specified B group vitamins: Secondary | ICD-10-CM | POA: Diagnosis not present

## 2022-05-01 DIAGNOSIS — E039 Hypothyroidism, unspecified: Secondary | ICD-10-CM | POA: Diagnosis not present

## 2022-05-01 DIAGNOSIS — E1165 Type 2 diabetes mellitus with hyperglycemia: Secondary | ICD-10-CM | POA: Diagnosis not present

## 2022-05-01 DIAGNOSIS — E7800 Pure hypercholesterolemia, unspecified: Secondary | ICD-10-CM | POA: Diagnosis not present

## 2022-05-01 DIAGNOSIS — I10 Essential (primary) hypertension: Secondary | ICD-10-CM | POA: Diagnosis not present

## 2022-05-24 DIAGNOSIS — D225 Melanocytic nevi of trunk: Secondary | ICD-10-CM | POA: Diagnosis not present

## 2022-05-24 DIAGNOSIS — D1801 Hemangioma of skin and subcutaneous tissue: Secondary | ICD-10-CM | POA: Diagnosis not present

## 2022-05-24 DIAGNOSIS — L821 Other seborrheic keratosis: Secondary | ICD-10-CM | POA: Diagnosis not present

## 2022-05-24 DIAGNOSIS — L905 Scar conditions and fibrosis of skin: Secondary | ICD-10-CM | POA: Diagnosis not present

## 2022-05-24 DIAGNOSIS — L280 Lichen simplex chronicus: Secondary | ICD-10-CM | POA: Diagnosis not present

## 2022-05-24 DIAGNOSIS — D485 Neoplasm of uncertain behavior of skin: Secondary | ICD-10-CM | POA: Diagnosis not present

## 2022-05-24 DIAGNOSIS — C44319 Basal cell carcinoma of skin of other parts of face: Secondary | ICD-10-CM | POA: Diagnosis not present

## 2022-05-24 DIAGNOSIS — D224 Melanocytic nevi of scalp and neck: Secondary | ICD-10-CM | POA: Diagnosis not present

## 2022-05-24 DIAGNOSIS — D2272 Melanocytic nevi of left lower limb, including hip: Secondary | ICD-10-CM | POA: Diagnosis not present

## 2022-05-29 DIAGNOSIS — E538 Deficiency of other specified B group vitamins: Secondary | ICD-10-CM | POA: Diagnosis not present

## 2022-06-12 DIAGNOSIS — K2970 Gastritis, unspecified, without bleeding: Secondary | ICD-10-CM | POA: Diagnosis not present

## 2022-06-12 DIAGNOSIS — K317 Polyp of stomach and duodenum: Secondary | ICD-10-CM | POA: Diagnosis not present

## 2022-06-12 DIAGNOSIS — R1310 Dysphagia, unspecified: Secondary | ICD-10-CM | POA: Diagnosis not present

## 2022-06-12 DIAGNOSIS — K219 Gastro-esophageal reflux disease without esophagitis: Secondary | ICD-10-CM | POA: Diagnosis not present

## 2022-06-12 DIAGNOSIS — Z09 Encounter for follow-up examination after completed treatment for conditions other than malignant neoplasm: Secondary | ICD-10-CM | POA: Diagnosis not present

## 2022-06-12 DIAGNOSIS — R197 Diarrhea, unspecified: Secondary | ICD-10-CM | POA: Diagnosis not present

## 2022-06-12 DIAGNOSIS — D124 Benign neoplasm of descending colon: Secondary | ICD-10-CM | POA: Diagnosis not present

## 2022-06-12 DIAGNOSIS — K319 Disease of stomach and duodenum, unspecified: Secondary | ICD-10-CM | POA: Diagnosis not present

## 2022-06-12 DIAGNOSIS — K648 Other hemorrhoids: Secondary | ICD-10-CM | POA: Diagnosis not present

## 2022-06-12 DIAGNOSIS — Z86010 Personal history of colonic polyps: Secondary | ICD-10-CM | POA: Diagnosis not present

## 2022-06-14 DIAGNOSIS — K317 Polyp of stomach and duodenum: Secondary | ICD-10-CM | POA: Diagnosis not present

## 2022-06-14 DIAGNOSIS — D124 Benign neoplasm of descending colon: Secondary | ICD-10-CM | POA: Diagnosis not present

## 2022-06-14 DIAGNOSIS — K319 Disease of stomach and duodenum, unspecified: Secondary | ICD-10-CM | POA: Diagnosis not present

## 2022-06-14 DIAGNOSIS — K219 Gastro-esophageal reflux disease without esophagitis: Secondary | ICD-10-CM | POA: Diagnosis not present

## 2022-06-20 DIAGNOSIS — R52 Pain, unspecified: Secondary | ICD-10-CM | POA: Diagnosis not present

## 2022-06-20 DIAGNOSIS — R051 Acute cough: Secondary | ICD-10-CM | POA: Diagnosis not present

## 2022-06-20 DIAGNOSIS — B349 Viral infection, unspecified: Secondary | ICD-10-CM | POA: Diagnosis not present

## 2022-06-20 DIAGNOSIS — Z03818 Encounter for observation for suspected exposure to other biological agents ruled out: Secondary | ICD-10-CM | POA: Diagnosis not present

## 2022-06-20 DIAGNOSIS — R0981 Nasal congestion: Secondary | ICD-10-CM | POA: Diagnosis not present

## 2022-06-26 DIAGNOSIS — H6593 Unspecified nonsuppurative otitis media, bilateral: Secondary | ICD-10-CM | POA: Diagnosis not present

## 2022-06-26 DIAGNOSIS — J0101 Acute recurrent maxillary sinusitis: Secondary | ICD-10-CM | POA: Diagnosis not present

## 2022-06-28 DIAGNOSIS — E538 Deficiency of other specified B group vitamins: Secondary | ICD-10-CM | POA: Diagnosis not present

## 2022-07-06 DIAGNOSIS — C44319 Basal cell carcinoma of skin of other parts of face: Secondary | ICD-10-CM | POA: Diagnosis not present

## 2022-07-06 DIAGNOSIS — Z85828 Personal history of other malignant neoplasm of skin: Secondary | ICD-10-CM | POA: Diagnosis not present

## 2022-07-25 DIAGNOSIS — R21 Rash and other nonspecific skin eruption: Secondary | ICD-10-CM | POA: Diagnosis not present

## 2022-07-25 DIAGNOSIS — E538 Deficiency of other specified B group vitamins: Secondary | ICD-10-CM | POA: Diagnosis not present

## 2022-07-25 DIAGNOSIS — E039 Hypothyroidism, unspecified: Secondary | ICD-10-CM | POA: Diagnosis not present

## 2022-07-25 DIAGNOSIS — E1165 Type 2 diabetes mellitus with hyperglycemia: Secondary | ICD-10-CM | POA: Diagnosis not present

## 2022-07-25 DIAGNOSIS — E7800 Pure hypercholesterolemia, unspecified: Secondary | ICD-10-CM | POA: Diagnosis not present

## 2022-07-27 ENCOUNTER — Telehealth: Payer: Self-pay | Admitting: *Deleted

## 2022-07-27 NOTE — Patient Outreach (Signed)
  Care Coordination   07/27/2022 Name: Macrina Lehnert MRN: [id number] DOB: [birth year]   Care Coordination Outreach Attempts:  An unsuccessful telephone outreach was attempted today to offer the patient information about available care coordination services as a benefit of their health plan.   Follow Up Plan:  Additional outreach attempts will be made to offer the patient care coordination information and services.   Encounter Outcome:  No Answer   Care Coordination Interventions:  No, not indicated    Raina Mina, RN Care Management Coordinator [place] Office [phone]

## 2022-07-31 DIAGNOSIS — E538 Deficiency of other specified B group vitamins: Secondary | ICD-10-CM | POA: Diagnosis not present

## 2022-08-01 DIAGNOSIS — I10 Essential (primary) hypertension: Secondary | ICD-10-CM | POA: Diagnosis not present

## 2022-08-01 DIAGNOSIS — E039 Hypothyroidism, unspecified: Secondary | ICD-10-CM | POA: Diagnosis not present

## 2022-08-01 DIAGNOSIS — E1165 Type 2 diabetes mellitus with hyperglycemia: Secondary | ICD-10-CM | POA: Diagnosis not present

## 2022-08-01 DIAGNOSIS — E7800 Pure hypercholesterolemia, unspecified: Secondary | ICD-10-CM | POA: Diagnosis not present

## 2022-08-09 DIAGNOSIS — E611 Iron deficiency: Secondary | ICD-10-CM | POA: Diagnosis not present

## 2022-08-09 DIAGNOSIS — F411 Generalized anxiety disorder: Secondary | ICD-10-CM | POA: Diagnosis not present

## 2022-08-09 DIAGNOSIS — Z0001 Encounter for general adult medical examination with abnormal findings: Secondary | ICD-10-CM | POA: Diagnosis not present

## 2022-08-09 DIAGNOSIS — E538 Deficiency of other specified B group vitamins: Secondary | ICD-10-CM | POA: Diagnosis not present

## 2022-08-09 DIAGNOSIS — E039 Hypothyroidism, unspecified: Secondary | ICD-10-CM | POA: Diagnosis not present

## 2022-08-09 DIAGNOSIS — G894 Chronic pain syndrome: Secondary | ICD-10-CM | POA: Diagnosis not present

## 2022-08-09 DIAGNOSIS — N1831 Chronic kidney disease, stage 3a: Secondary | ICD-10-CM | POA: Diagnosis not present

## 2022-08-09 DIAGNOSIS — Z794 Long term (current) use of insulin: Secondary | ICD-10-CM | POA: Diagnosis not present

## 2022-08-09 DIAGNOSIS — E7800 Pure hypercholesterolemia, unspecified: Secondary | ICD-10-CM | POA: Diagnosis not present

## 2022-08-09 DIAGNOSIS — E1122 Type 2 diabetes mellitus with diabetic chronic kidney disease: Secondary | ICD-10-CM | POA: Diagnosis not present

## 2022-08-09 DIAGNOSIS — Z79899 Other long term (current) drug therapy: Secondary | ICD-10-CM | POA: Diagnosis not present

## 2022-08-14 DIAGNOSIS — R1310 Dysphagia, unspecified: Secondary | ICD-10-CM | POA: Diagnosis not present

## 2022-08-14 DIAGNOSIS — R197 Diarrhea, unspecified: Secondary | ICD-10-CM | POA: Diagnosis not present

## 2022-08-14 DIAGNOSIS — K219 Gastro-esophageal reflux disease without esophagitis: Secondary | ICD-10-CM | POA: Diagnosis not present

## 2022-08-14 DIAGNOSIS — Z86010 Personal history of colonic polyps: Secondary | ICD-10-CM | POA: Diagnosis not present

## 2022-08-31 DIAGNOSIS — E538 Deficiency of other specified B group vitamins: Secondary | ICD-10-CM | POA: Diagnosis not present

## 2022-09-19 DIAGNOSIS — E039 Hypothyroidism, unspecified: Secondary | ICD-10-CM | POA: Diagnosis not present

## 2022-09-19 DIAGNOSIS — E1165 Type 2 diabetes mellitus with hyperglycemia: Secondary | ICD-10-CM | POA: Diagnosis not present

## 2022-09-19 DIAGNOSIS — E7800 Pure hypercholesterolemia, unspecified: Secondary | ICD-10-CM | POA: Diagnosis not present

## 2022-09-19 DIAGNOSIS — I10 Essential (primary) hypertension: Secondary | ICD-10-CM | POA: Diagnosis not present

## 2022-09-29 DIAGNOSIS — E113291 Type 2 diabetes mellitus with mild nonproliferative diabetic retinopathy without macular edema, right eye: Secondary | ICD-10-CM | POA: Diagnosis not present

## 2022-10-04 DIAGNOSIS — E538 Deficiency of other specified B group vitamins: Secondary | ICD-10-CM | POA: Diagnosis not present

## 2022-10-31 DIAGNOSIS — E538 Deficiency of other specified B group vitamins: Secondary | ICD-10-CM | POA: Diagnosis not present

## 2022-11-30 DIAGNOSIS — Z9079 Acquired absence of other genital organ(s): Secondary | ICD-10-CM | POA: Diagnosis not present

## 2022-11-30 DIAGNOSIS — Z1231 Encounter for screening mammogram for malignant neoplasm of breast: Secondary | ICD-10-CM | POA: Diagnosis not present

## 2022-11-30 DIAGNOSIS — Z124 Encounter for screening for malignant neoplasm of cervix: Secondary | ICD-10-CM | POA: Diagnosis not present

## 2022-11-30 DIAGNOSIS — Z01419 Encounter for gynecological examination (general) (routine) without abnormal findings: Secondary | ICD-10-CM | POA: Diagnosis not present

## 2022-11-30 DIAGNOSIS — Z01411 Encounter for gynecological examination (general) (routine) with abnormal findings: Secondary | ICD-10-CM | POA: Diagnosis not present

## 2022-11-30 DIAGNOSIS — Z79890 Hormone replacement therapy (postmenopausal): Secondary | ICD-10-CM | POA: Diagnosis not present

## 2022-11-30 DIAGNOSIS — N959 Unspecified menopausal and perimenopausal disorder: Secondary | ICD-10-CM | POA: Diagnosis not present

## 2022-11-30 DIAGNOSIS — Z6834 Body mass index (BMI) 34.0-34.9, adult: Secondary | ICD-10-CM | POA: Diagnosis not present

## 2022-12-05 DIAGNOSIS — E538 Deficiency of other specified B group vitamins: Secondary | ICD-10-CM | POA: Diagnosis not present

## 2023-01-03 DIAGNOSIS — E538 Deficiency of other specified B group vitamins: Secondary | ICD-10-CM | POA: Diagnosis not present

## 2023-01-19 DIAGNOSIS — E039 Hypothyroidism, unspecified: Secondary | ICD-10-CM | POA: Diagnosis not present

## 2023-01-19 DIAGNOSIS — E1165 Type 2 diabetes mellitus with hyperglycemia: Secondary | ICD-10-CM | POA: Diagnosis not present

## 2023-01-26 ENCOUNTER — Other Ambulatory Visit: Payer: Self-pay | Admitting: Endocrinology

## 2023-01-26 DIAGNOSIS — E039 Hypothyroidism, unspecified: Secondary | ICD-10-CM | POA: Diagnosis not present

## 2023-01-26 DIAGNOSIS — E1165 Type 2 diabetes mellitus with hyperglycemia: Secondary | ICD-10-CM | POA: Diagnosis not present

## 2023-01-26 DIAGNOSIS — I10 Essential (primary) hypertension: Secondary | ICD-10-CM | POA: Diagnosis not present

## 2023-01-26 DIAGNOSIS — E010 Iodine-deficiency related diffuse (endemic) goiter: Secondary | ICD-10-CM

## 2023-01-26 DIAGNOSIS — E7800 Pure hypercholesterolemia, unspecified: Secondary | ICD-10-CM | POA: Diagnosis not present

## 2023-02-05 DIAGNOSIS — E538 Deficiency of other specified B group vitamins: Secondary | ICD-10-CM | POA: Diagnosis not present

## 2023-02-05 DIAGNOSIS — N1831 Chronic kidney disease, stage 3a: Secondary | ICD-10-CM | POA: Diagnosis not present

## 2023-02-05 DIAGNOSIS — R21 Rash and other nonspecific skin eruption: Secondary | ICD-10-CM | POA: Diagnosis not present

## 2023-02-05 DIAGNOSIS — E1122 Type 2 diabetes mellitus with diabetic chronic kidney disease: Secondary | ICD-10-CM | POA: Diagnosis not present

## 2023-02-05 DIAGNOSIS — E611 Iron deficiency: Secondary | ICD-10-CM | POA: Diagnosis not present

## 2023-02-05 DIAGNOSIS — G729 Myopathy, unspecified: Secondary | ICD-10-CM | POA: Diagnosis not present

## 2023-02-05 DIAGNOSIS — E1165 Type 2 diabetes mellitus with hyperglycemia: Secondary | ICD-10-CM | POA: Diagnosis not present

## 2023-02-05 DIAGNOSIS — Z79899 Other long term (current) drug therapy: Secondary | ICD-10-CM | POA: Diagnosis not present

## 2023-02-07 DIAGNOSIS — E538 Deficiency of other specified B group vitamins: Secondary | ICD-10-CM | POA: Diagnosis not present

## 2023-02-08 ENCOUNTER — Ambulatory Visit
Admission: RE | Admit: 2023-02-08 | Discharge: 2023-02-08 | Disposition: A | Discharge status: Home / Self Care | Payer: Medicare HMO | Source: Ambulatory Visit | Attending: Endocrinology | Admitting: Endocrinology

## 2023-02-08 DIAGNOSIS — R221 Localized swelling, mass and lump, neck: Secondary | ICD-10-CM | POA: Diagnosis not present

## 2023-02-08 DIAGNOSIS — E010 Iodine-deficiency related diffuse (endemic) goiter: Secondary | ICD-10-CM

## 2023-02-13 DIAGNOSIS — E7800 Pure hypercholesterolemia, unspecified: Secondary | ICD-10-CM | POA: Diagnosis not present

## 2023-02-13 DIAGNOSIS — E1165 Type 2 diabetes mellitus with hyperglycemia: Secondary | ICD-10-CM | POA: Diagnosis not present

## 2023-02-13 DIAGNOSIS — I10 Essential (primary) hypertension: Secondary | ICD-10-CM | POA: Diagnosis not present

## 2023-02-13 DIAGNOSIS — E039 Hypothyroidism, unspecified: Secondary | ICD-10-CM | POA: Diagnosis not present

## 2023-02-14 ENCOUNTER — Other Ambulatory Visit: Payer: Self-pay | Admitting: Endocrinology

## 2023-02-14 DIAGNOSIS — E041 Nontoxic single thyroid nodule: Secondary | ICD-10-CM

## 2023-03-06 DIAGNOSIS — E538 Deficiency of other specified B group vitamins: Secondary | ICD-10-CM | POA: Diagnosis not present

## 2023-03-16 ENCOUNTER — Telehealth: Payer: Medicare HMO

## 2023-04-02 DIAGNOSIS — E113293 Type 2 diabetes mellitus with mild nonproliferative diabetic retinopathy without macular edema, bilateral: Secondary | ICD-10-CM | POA: Diagnosis not present

## 2023-04-09 DIAGNOSIS — E538 Deficiency of other specified B group vitamins: Secondary | ICD-10-CM | POA: Diagnosis not present

## 2023-05-07 DIAGNOSIS — E611 Iron deficiency: Secondary | ICD-10-CM | POA: Diagnosis not present

## 2023-05-08 DIAGNOSIS — E538 Deficiency of other specified B group vitamins: Secondary | ICD-10-CM | POA: Diagnosis not present

## 2023-05-30 DIAGNOSIS — L570 Actinic keratosis: Secondary | ICD-10-CM | POA: Diagnosis not present

## 2023-05-30 DIAGNOSIS — D692 Other nonthrombocytopenic purpura: Secondary | ICD-10-CM | POA: Diagnosis not present

## 2023-05-30 DIAGNOSIS — L905 Scar conditions and fibrosis of skin: Secondary | ICD-10-CM | POA: Diagnosis not present

## 2023-05-30 DIAGNOSIS — D2272 Melanocytic nevi of left lower limb, including hip: Secondary | ICD-10-CM | POA: Diagnosis not present

## 2023-05-30 DIAGNOSIS — L821 Other seborrheic keratosis: Secondary | ICD-10-CM | POA: Diagnosis not present

## 2023-05-30 DIAGNOSIS — B078 Other viral warts: Secondary | ICD-10-CM | POA: Diagnosis not present

## 2023-05-30 DIAGNOSIS — E039 Hypothyroidism, unspecified: Secondary | ICD-10-CM | POA: Diagnosis not present

## 2023-05-30 DIAGNOSIS — E7800 Pure hypercholesterolemia, unspecified: Secondary | ICD-10-CM | POA: Diagnosis not present

## 2023-05-30 DIAGNOSIS — D224 Melanocytic nevi of scalp and neck: Secondary | ICD-10-CM | POA: Diagnosis not present

## 2023-05-30 DIAGNOSIS — E1165 Type 2 diabetes mellitus with hyperglycemia: Secondary | ICD-10-CM | POA: Diagnosis not present

## 2023-05-30 DIAGNOSIS — Z85828 Personal history of other malignant neoplasm of skin: Secondary | ICD-10-CM | POA: Diagnosis not present

## 2023-05-30 DIAGNOSIS — D2239 Melanocytic nevi of other parts of face: Secondary | ICD-10-CM | POA: Diagnosis not present

## 2023-05-30 DIAGNOSIS — D225 Melanocytic nevi of trunk: Secondary | ICD-10-CM | POA: Diagnosis not present

## 2023-05-30 DIAGNOSIS — I10 Essential (primary) hypertension: Secondary | ICD-10-CM | POA: Diagnosis not present

## 2023-06-06 DIAGNOSIS — E538 Deficiency of other specified B group vitamins: Secondary | ICD-10-CM | POA: Diagnosis not present

## 2023-06-18 ENCOUNTER — Other Ambulatory Visit (HOSPITAL_COMMUNITY): Payer: Self-pay | Admitting: Family Medicine

## 2023-06-18 DIAGNOSIS — R300 Dysuria: Secondary | ICD-10-CM | POA: Diagnosis not present

## 2023-06-18 DIAGNOSIS — R109 Unspecified abdominal pain: Secondary | ICD-10-CM

## 2023-06-19 ENCOUNTER — Ambulatory Visit (HOSPITAL_COMMUNITY)
Admission: RE | Admit: 2023-06-19 | Discharge: 2023-06-19 | Disposition: A | Discharge status: Home / Self Care | Payer: Medicare HMO | Source: Ambulatory Visit | Attending: Family Medicine | Admitting: Family Medicine

## 2023-06-19 DIAGNOSIS — R109 Unspecified abdominal pain: Secondary | ICD-10-CM | POA: Insufficient documentation

## 2023-06-19 DIAGNOSIS — K5732 Diverticulitis of large intestine without perforation or abscess without bleeding: Secondary | ICD-10-CM | POA: Diagnosis not present

## 2023-06-19 LAB — POCT I-STAT CREATININE: Creatinine, Ser: 1 mg/dL (ref 0.44–1.00)

## 2023-06-19 MED ORDER — IOHEXOL 300 MG/ML  SOLN
30.0000 mL | Freq: Once | INTRAMUSCULAR | Status: AC | PRN
  Administered 2023-06-19: 30 mL via ORAL

## 2023-06-19 MED ORDER — IOHEXOL 300 MG/ML  SOLN
100.0000 mL | Freq: Once | INTRAMUSCULAR | Status: AC | PRN
  Administered 2023-06-19: 100 mL via INTRAVENOUS

## 2023-07-09 DIAGNOSIS — E538 Deficiency of other specified B group vitamins: Secondary | ICD-10-CM | POA: Diagnosis not present

## 2023-08-01 DIAGNOSIS — J029 Acute pharyngitis, unspecified: Secondary | ICD-10-CM | POA: Diagnosis not present

## 2023-08-01 DIAGNOSIS — J069 Acute upper respiratory infection, unspecified: Secondary | ICD-10-CM | POA: Diagnosis not present

## 2023-08-01 DIAGNOSIS — N1831 Chronic kidney disease, stage 3a: Secondary | ICD-10-CM | POA: Diagnosis not present

## 2023-08-01 DIAGNOSIS — E1122 Type 2 diabetes mellitus with diabetic chronic kidney disease: Secondary | ICD-10-CM | POA: Diagnosis not present

## 2023-08-08 DIAGNOSIS — J019 Acute sinusitis, unspecified: Secondary | ICD-10-CM | POA: Diagnosis not present

## 2023-08-08 DIAGNOSIS — N1831 Chronic kidney disease, stage 3a: Secondary | ICD-10-CM | POA: Diagnosis not present

## 2023-08-08 DIAGNOSIS — E1122 Type 2 diabetes mellitus with diabetic chronic kidney disease: Secondary | ICD-10-CM | POA: Diagnosis not present

## 2023-08-08 DIAGNOSIS — E538 Deficiency of other specified B group vitamins: Secondary | ICD-10-CM | POA: Diagnosis not present

## 2023-08-24 ENCOUNTER — Other Ambulatory Visit: Payer: Self-pay | Admitting: Family Medicine

## 2023-08-24 DIAGNOSIS — G894 Chronic pain syndrome: Secondary | ICD-10-CM | POA: Diagnosis not present

## 2023-08-24 DIAGNOSIS — E039 Hypothyroidism, unspecified: Secondary | ICD-10-CM | POA: Diagnosis not present

## 2023-08-24 DIAGNOSIS — N1831 Chronic kidney disease, stage 3a: Secondary | ICD-10-CM | POA: Diagnosis not present

## 2023-08-24 DIAGNOSIS — Z0001 Encounter for general adult medical examination with abnormal findings: Secondary | ICD-10-CM | POA: Diagnosis not present

## 2023-08-24 DIAGNOSIS — E538 Deficiency of other specified B group vitamins: Secondary | ICD-10-CM | POA: Diagnosis not present

## 2023-08-24 DIAGNOSIS — G729 Myopathy, unspecified: Secondary | ICD-10-CM | POA: Diagnosis not present

## 2023-08-24 DIAGNOSIS — E1122 Type 2 diabetes mellitus with diabetic chronic kidney disease: Secondary | ICD-10-CM | POA: Diagnosis not present

## 2023-08-24 DIAGNOSIS — E1165 Type 2 diabetes mellitus with hyperglycemia: Secondary | ICD-10-CM | POA: Diagnosis not present

## 2023-08-24 DIAGNOSIS — E2839 Other primary ovarian failure: Secondary | ICD-10-CM

## 2023-08-24 DIAGNOSIS — E611 Iron deficiency: Secondary | ICD-10-CM | POA: Diagnosis not present

## 2023-08-24 DIAGNOSIS — E7800 Pure hypercholesterolemia, unspecified: Secondary | ICD-10-CM | POA: Diagnosis not present

## 2023-08-24 DIAGNOSIS — F411 Generalized anxiety disorder: Secondary | ICD-10-CM | POA: Diagnosis not present

## 2023-09-07 DIAGNOSIS — E538 Deficiency of other specified B group vitamins: Secondary | ICD-10-CM | POA: Diagnosis not present

## 2023-09-14 DIAGNOSIS — I10 Essential (primary) hypertension: Secondary | ICD-10-CM | POA: Diagnosis not present

## 2023-09-14 DIAGNOSIS — U071 COVID-19: Secondary | ICD-10-CM | POA: Diagnosis not present

## 2023-09-14 DIAGNOSIS — E119 Type 2 diabetes mellitus without complications: Secondary | ICD-10-CM | POA: Diagnosis not present

## 2023-09-14 DIAGNOSIS — R490 Dysphonia: Secondary | ICD-10-CM | POA: Diagnosis not present

## 2023-09-14 DIAGNOSIS — R053 Chronic cough: Secondary | ICD-10-CM | POA: Diagnosis not present

## 2023-09-21 DIAGNOSIS — J392 Other diseases of pharynx: Secondary | ICD-10-CM | POA: Diagnosis not present

## 2023-09-21 DIAGNOSIS — G9331 Postviral fatigue syndrome: Secondary | ICD-10-CM | POA: Diagnosis not present

## 2023-09-24 ENCOUNTER — Other Ambulatory Visit: Payer: Self-pay | Admitting: Gastroenterology

## 2023-09-24 DIAGNOSIS — R1310 Dysphagia, unspecified: Secondary | ICD-10-CM | POA: Diagnosis not present

## 2023-09-24 DIAGNOSIS — R6889 Other general symptoms and signs: Secondary | ICD-10-CM | POA: Diagnosis not present

## 2023-09-24 DIAGNOSIS — Z860100 Personal history of colon polyps, unspecified: Secondary | ICD-10-CM | POA: Diagnosis not present

## 2023-09-24 DIAGNOSIS — R197 Diarrhea, unspecified: Secondary | ICD-10-CM | POA: Diagnosis not present

## 2023-09-24 DIAGNOSIS — K219 Gastro-esophageal reflux disease without esophagitis: Secondary | ICD-10-CM | POA: Diagnosis not present

## 2023-10-01 DIAGNOSIS — Z0100 Encounter for examination of eyes and vision without abnormal findings: Secondary | ICD-10-CM | POA: Diagnosis not present

## 2023-10-01 DIAGNOSIS — E113291 Type 2 diabetes mellitus with mild nonproliferative diabetic retinopathy without macular edema, right eye: Secondary | ICD-10-CM | POA: Diagnosis not present

## 2023-10-02 ENCOUNTER — Other Ambulatory Visit (HOSPITAL_COMMUNITY): Payer: Self-pay | Admitting: Endocrinology

## 2023-10-02 DIAGNOSIS — E039 Hypothyroidism, unspecified: Secondary | ICD-10-CM | POA: Diagnosis not present

## 2023-10-02 DIAGNOSIS — E1165 Type 2 diabetes mellitus with hyperglycemia: Secondary | ICD-10-CM | POA: Diagnosis not present

## 2023-10-02 DIAGNOSIS — E049 Nontoxic goiter, unspecified: Secondary | ICD-10-CM

## 2023-10-02 DIAGNOSIS — E538 Deficiency of other specified B group vitamins: Secondary | ICD-10-CM | POA: Diagnosis not present

## 2023-10-02 DIAGNOSIS — I10 Essential (primary) hypertension: Secondary | ICD-10-CM | POA: Diagnosis not present

## 2023-10-02 DIAGNOSIS — E7800 Pure hypercholesterolemia, unspecified: Secondary | ICD-10-CM | POA: Diagnosis not present

## 2023-10-05 DIAGNOSIS — E538 Deficiency of other specified B group vitamins: Secondary | ICD-10-CM | POA: Diagnosis not present

## 2023-10-06 ENCOUNTER — Other Ambulatory Visit: Payer: Medicare HMO

## 2023-10-08 ENCOUNTER — Ambulatory Visit (HOSPITAL_BASED_OUTPATIENT_CLINIC_OR_DEPARTMENT_OTHER)

## 2023-10-08 ENCOUNTER — Encounter (HOSPITAL_BASED_OUTPATIENT_CLINIC_OR_DEPARTMENT_OTHER): Payer: Self-pay

## 2023-11-05 DIAGNOSIS — E538 Deficiency of other specified B group vitamins: Secondary | ICD-10-CM | POA: Diagnosis not present

## 2023-12-04 DIAGNOSIS — Z124 Encounter for screening for malignant neoplasm of cervix: Secondary | ICD-10-CM | POA: Diagnosis not present

## 2023-12-04 DIAGNOSIS — Z1231 Encounter for screening mammogram for malignant neoplasm of breast: Secondary | ICD-10-CM | POA: Diagnosis not present

## 2023-12-04 DIAGNOSIS — Z1331 Encounter for screening for depression: Secondary | ICD-10-CM | POA: Diagnosis not present

## 2023-12-05 DIAGNOSIS — E538 Deficiency of other specified B group vitamins: Secondary | ICD-10-CM | POA: Diagnosis not present

## 2024-01-04 DIAGNOSIS — E538 Deficiency of other specified B group vitamins: Secondary | ICD-10-CM | POA: Diagnosis not present

## 2024-01-26 IMAGING — DX DG SHOULDER 2+V*L*
3 series · 3 of 3 positions shown · non-contrast
Comparison: None Available.

CLINICAL DATA: Fall

EXAM:
LEFT SHOULDER - 2+ VIEW

[shoulder grashey]
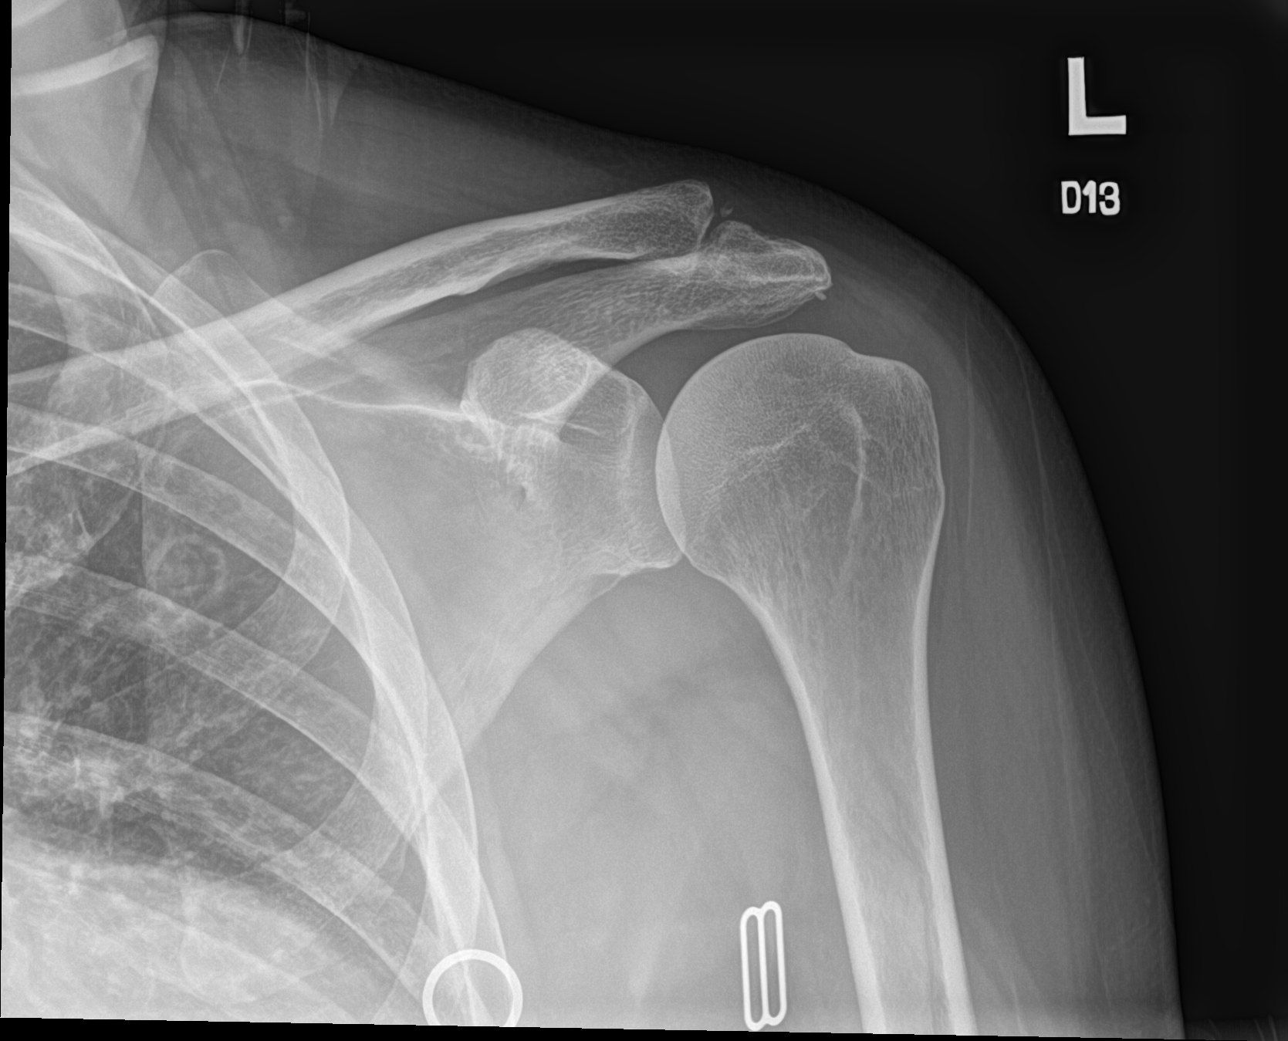

[shoulder y view]
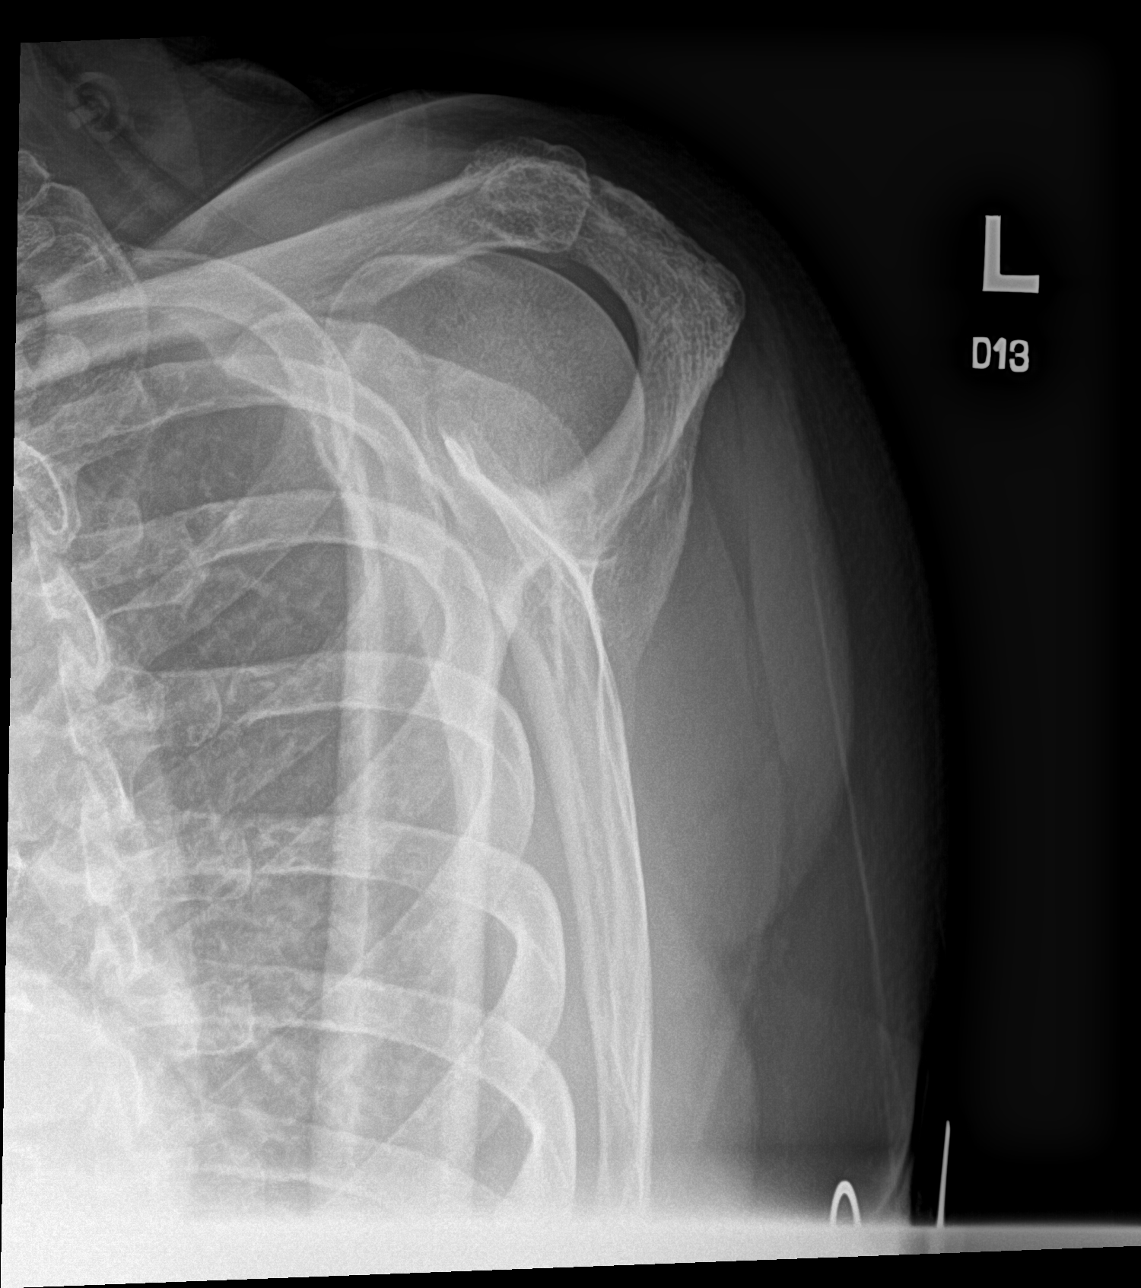

[shoulder axillary]
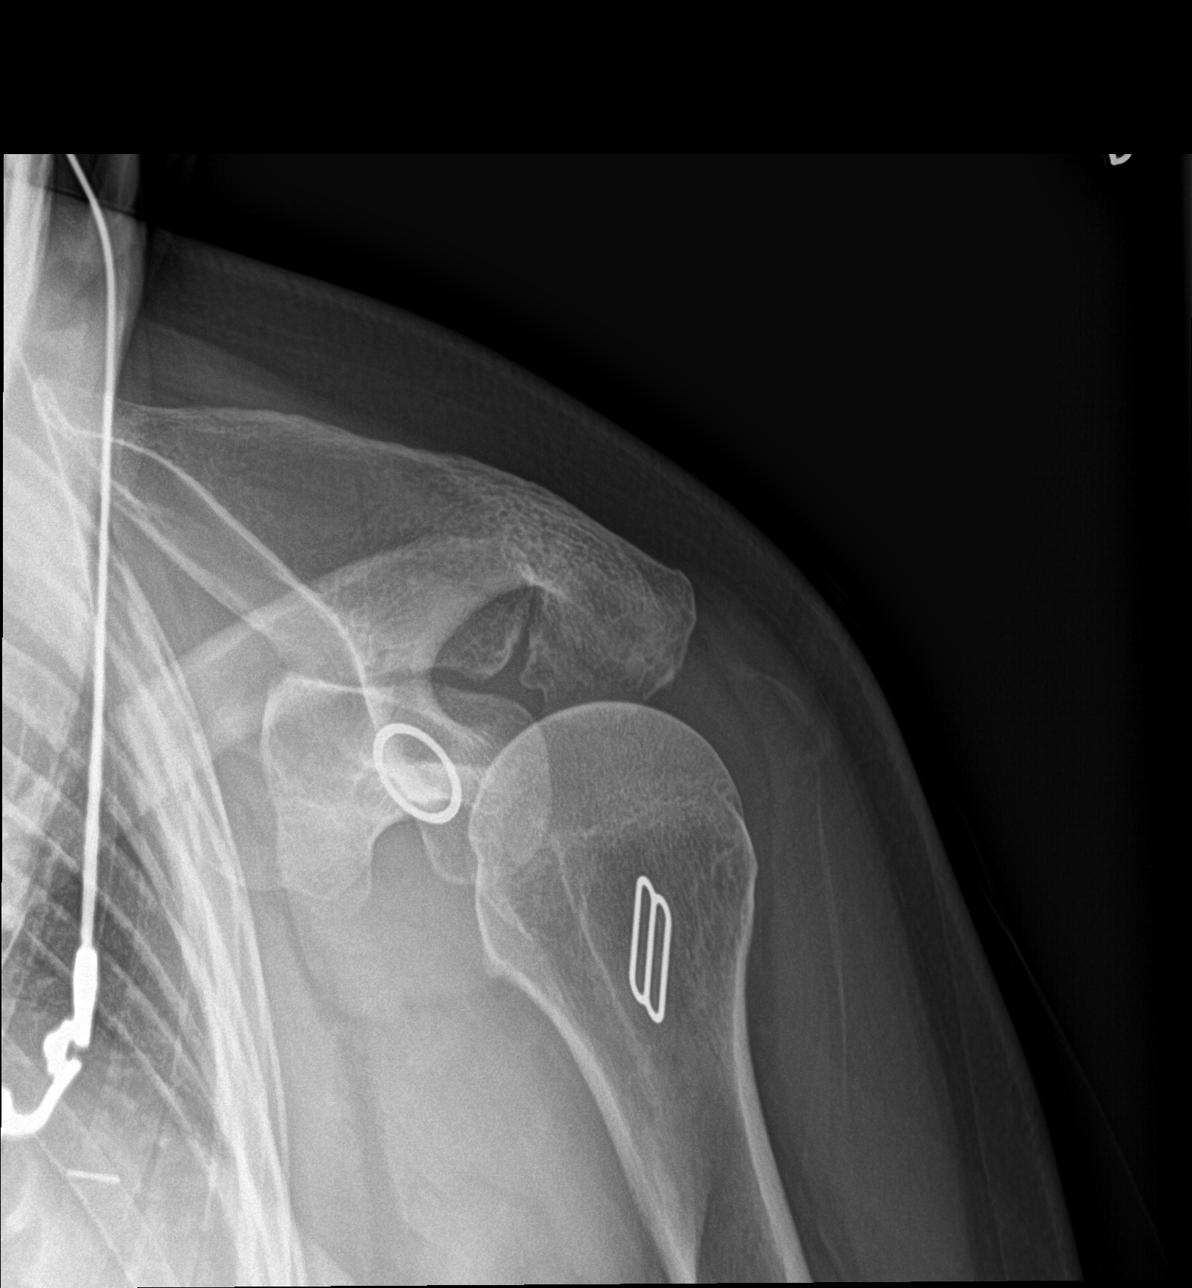

[3 of 3 positions shown; findings below may reference images not displayed]

FINDINGS: No fracture or malalignment.  Mild AC joint degenerative change
IMPRESSION: No acute osseous abnormality

## 2024-01-26 IMAGING — DX DG KNEE COMPLETE 4+V*L*
4 series · 4 of 4 positions shown · non-contrast
Comparison: None Available.

CLINICAL DATA: Fall

EXAM:
LEFT KNEE - COMPLETE 4+ VIEW

[knee ap]
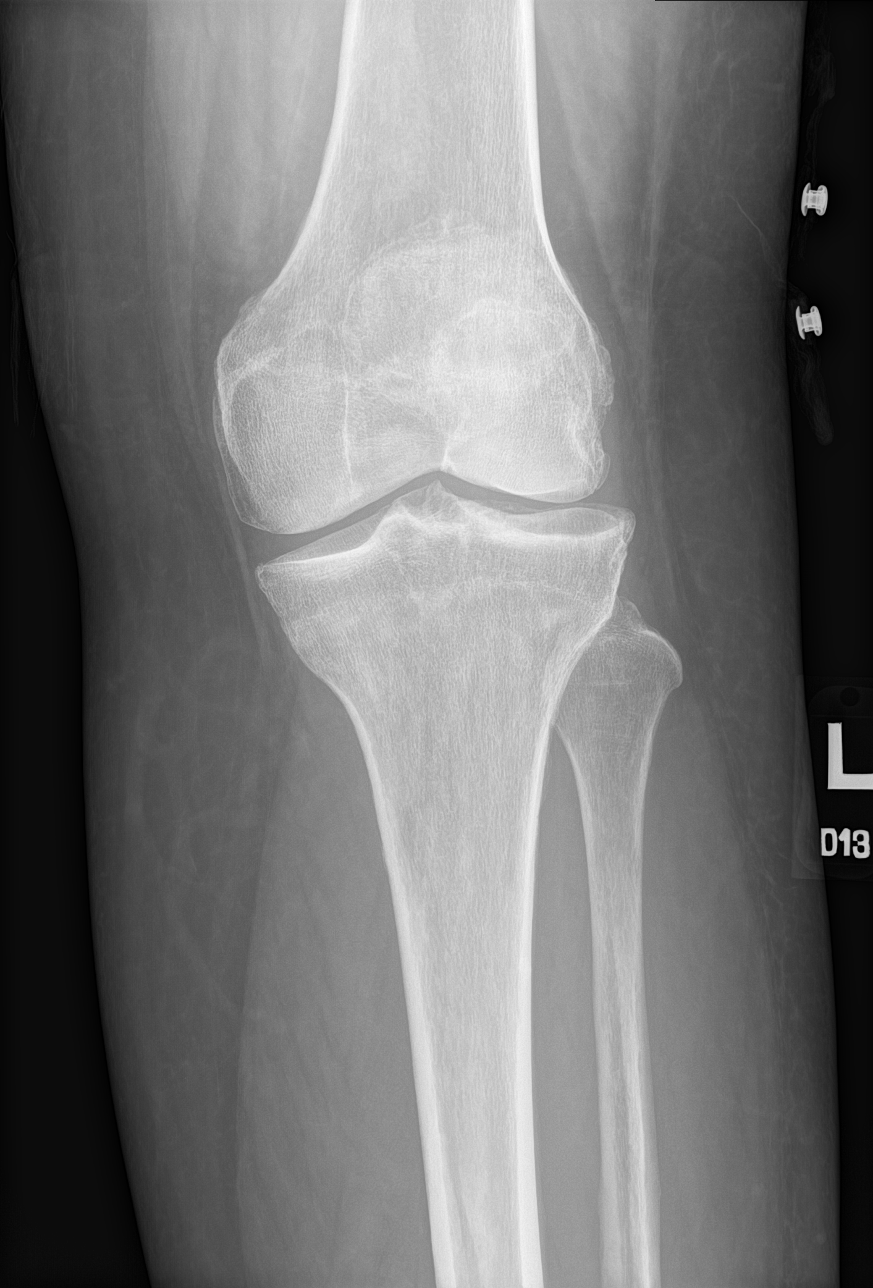

[knee obl (1 of 2)]
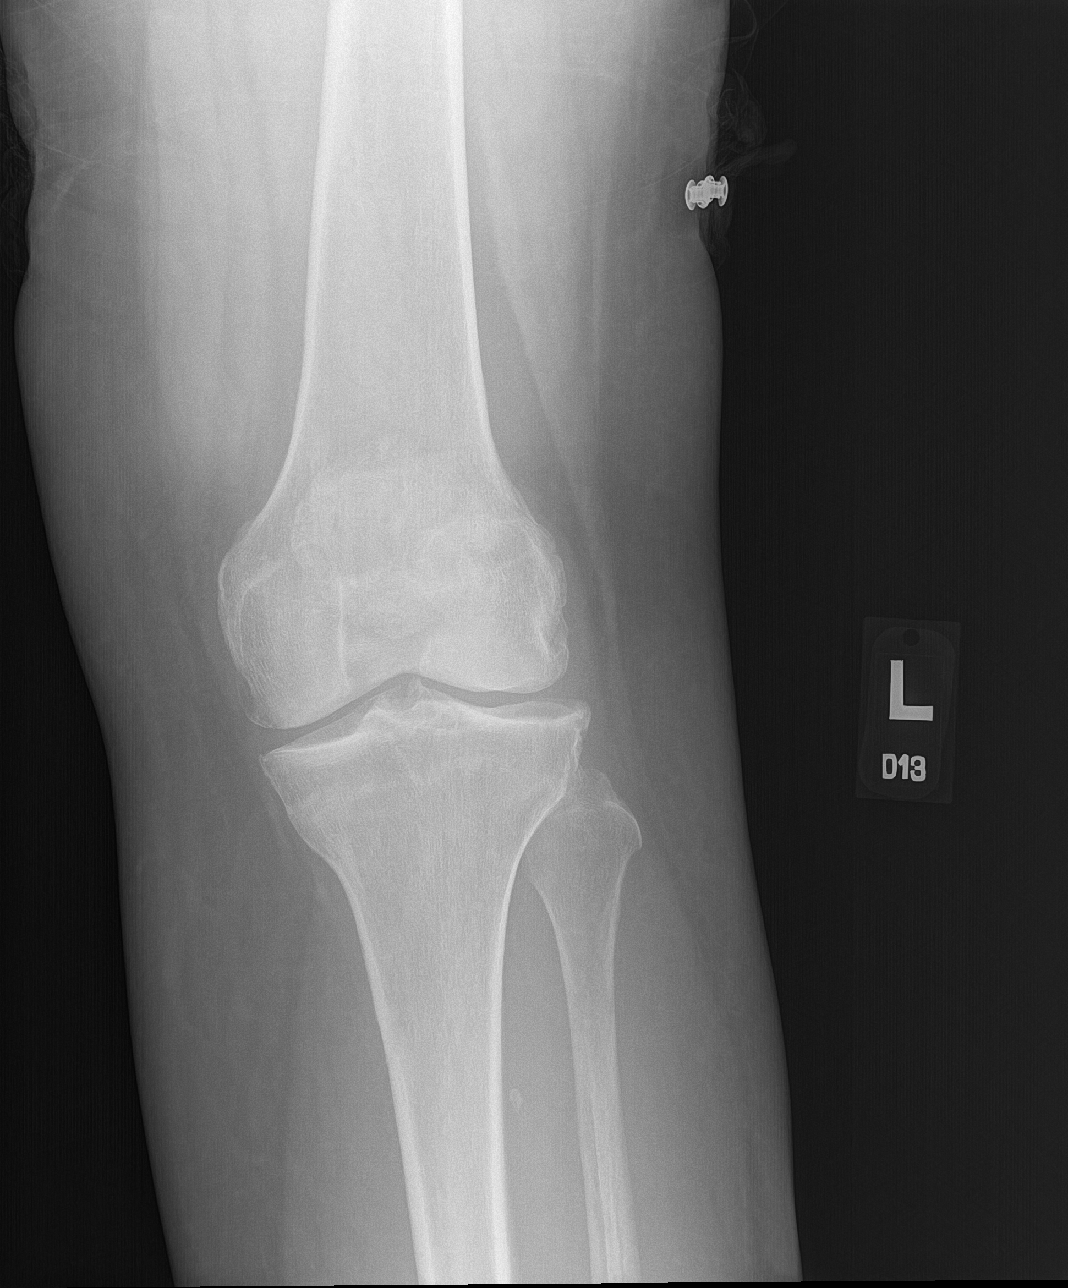

[knee obl (2 of 2)]
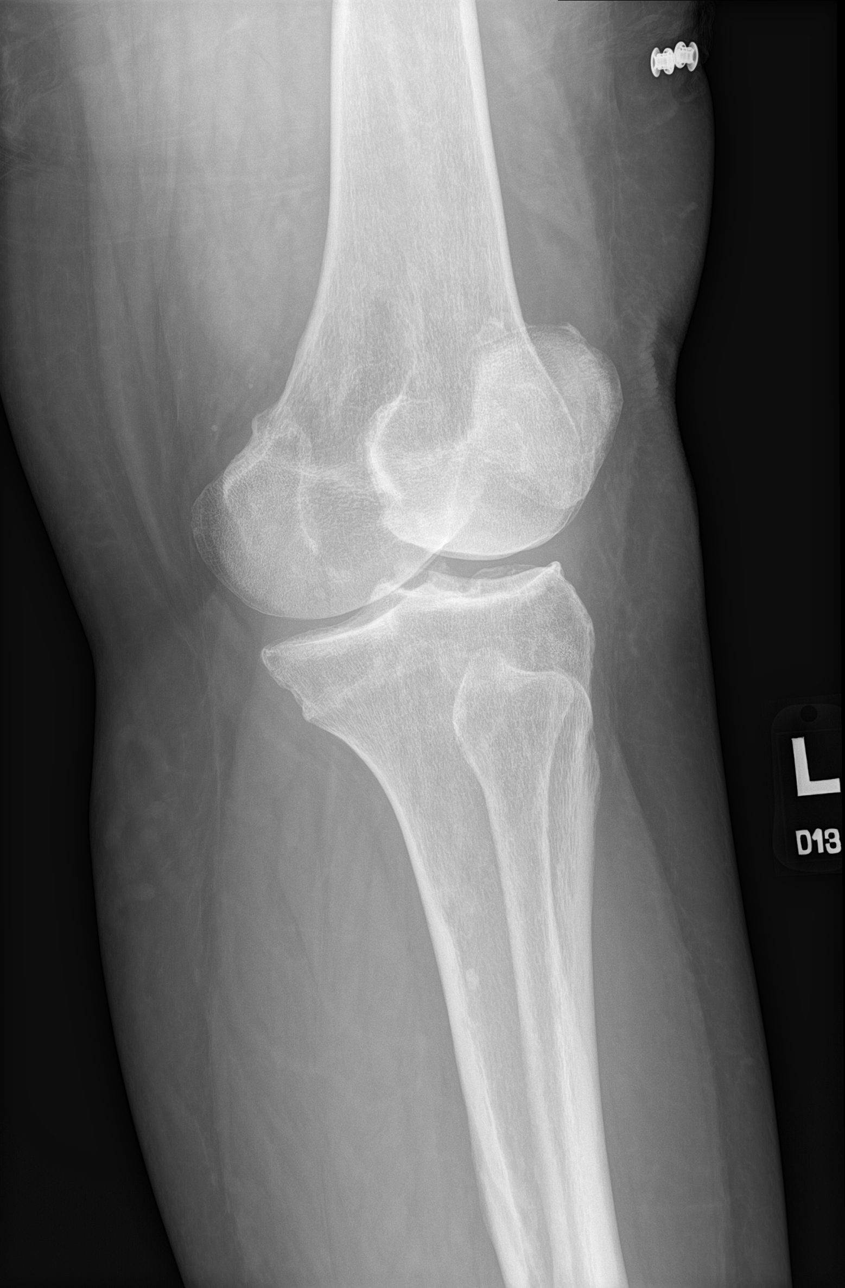

[knee lat]
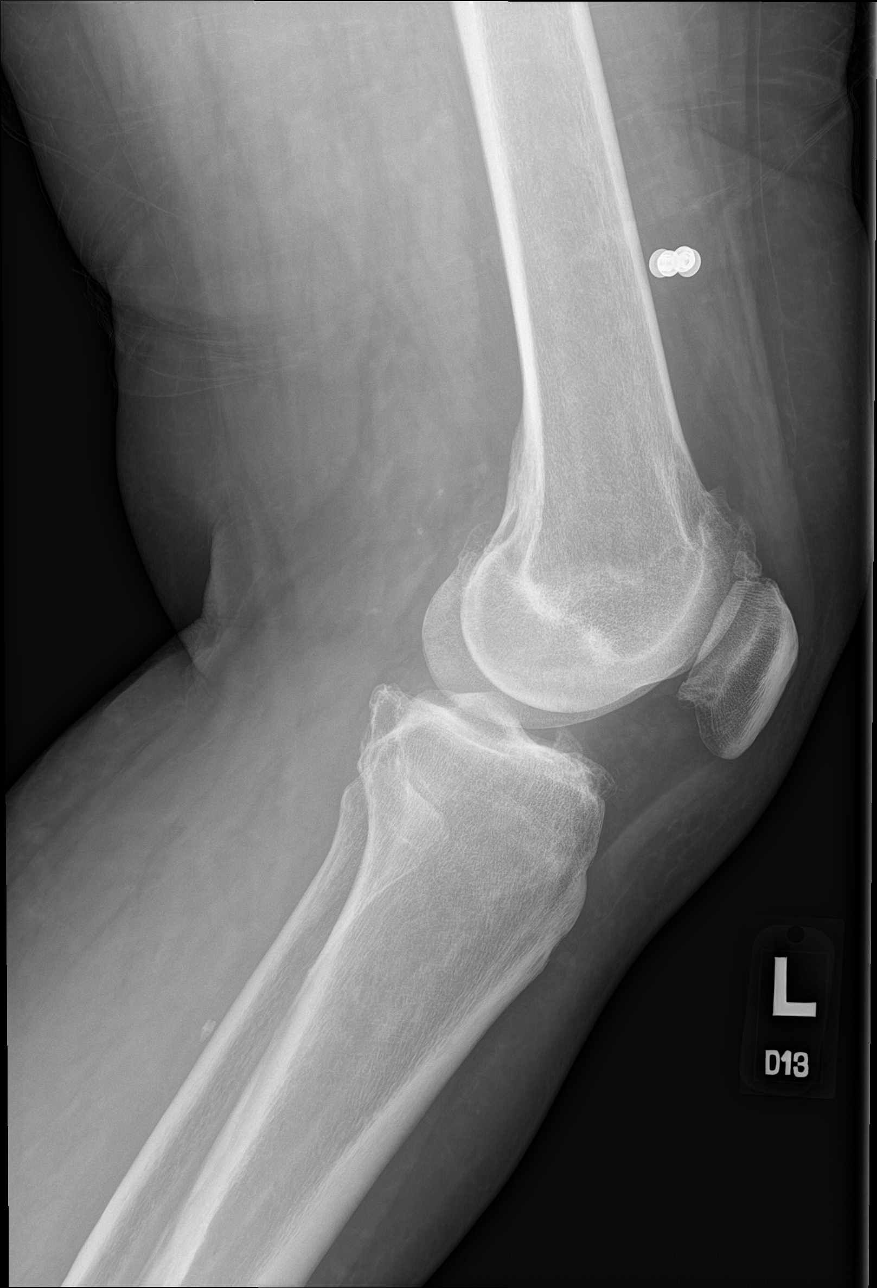

[4 of 4 positions shown; findings below may reference images not displayed]

FINDINGS: No fracture or malalignment. No sizable knee effusion.
Tricompartment arthritis with moderate patellofemoral degenerative
change.
IMPRESSION: No acute osseous abnormality

## 2024-02-07 DIAGNOSIS — R300 Dysuria: Secondary | ICD-10-CM | POA: Diagnosis not present

## 2024-02-11 DIAGNOSIS — E538 Deficiency of other specified B group vitamins: Secondary | ICD-10-CM | POA: Diagnosis not present

## 2024-02-22 DIAGNOSIS — E538 Deficiency of other specified B group vitamins: Secondary | ICD-10-CM | POA: Diagnosis not present

## 2024-02-22 DIAGNOSIS — R300 Dysuria: Secondary | ICD-10-CM | POA: Diagnosis not present

## 2024-02-22 DIAGNOSIS — N1831 Chronic kidney disease, stage 3a: Secondary | ICD-10-CM | POA: Diagnosis not present

## 2024-02-22 DIAGNOSIS — E039 Hypothyroidism, unspecified: Secondary | ICD-10-CM | POA: Diagnosis not present

## 2024-02-22 DIAGNOSIS — E1122 Type 2 diabetes mellitus with diabetic chronic kidney disease: Secondary | ICD-10-CM | POA: Diagnosis not present

## 2024-02-22 DIAGNOSIS — D72829 Elevated white blood cell count, unspecified: Secondary | ICD-10-CM | POA: Diagnosis not present

## 2024-02-22 DIAGNOSIS — G729 Myopathy, unspecified: Secondary | ICD-10-CM | POA: Diagnosis not present

## 2024-02-22 DIAGNOSIS — G894 Chronic pain syndrome: Secondary | ICD-10-CM | POA: Diagnosis not present

## 2024-02-22 DIAGNOSIS — Z79899 Other long term (current) drug therapy: Secondary | ICD-10-CM | POA: Diagnosis not present

## 2024-03-03 DIAGNOSIS — Z7189 Other specified counseling: Secondary | ICD-10-CM | POA: Diagnosis not present

## 2024-03-03 DIAGNOSIS — Z79890 Hormone replacement therapy (postmenopausal): Secondary | ICD-10-CM | POA: Diagnosis not present

## 2024-03-07 DIAGNOSIS — N1831 Chronic kidney disease, stage 3a: Secondary | ICD-10-CM | POA: Diagnosis not present

## 2024-03-07 DIAGNOSIS — E538 Deficiency of other specified B group vitamins: Secondary | ICD-10-CM | POA: Diagnosis not present

## 2024-03-07 DIAGNOSIS — D72829 Elevated white blood cell count, unspecified: Secondary | ICD-10-CM | POA: Diagnosis not present

## 2024-04-02 DIAGNOSIS — E538 Deficiency of other specified B group vitamins: Secondary | ICD-10-CM | POA: Diagnosis not present

## 2024-04-03 DIAGNOSIS — E7800 Pure hypercholesterolemia, unspecified: Secondary | ICD-10-CM | POA: Diagnosis not present

## 2024-04-03 DIAGNOSIS — I10 Essential (primary) hypertension: Secondary | ICD-10-CM | POA: Diagnosis not present

## 2024-04-03 DIAGNOSIS — E039 Hypothyroidism, unspecified: Secondary | ICD-10-CM | POA: Diagnosis not present

## 2024-04-03 DIAGNOSIS — E1165 Type 2 diabetes mellitus with hyperglycemia: Secondary | ICD-10-CM | POA: Diagnosis not present

## 2024-04-17 DIAGNOSIS — K219 Gastro-esophageal reflux disease without esophagitis: Secondary | ICD-10-CM | POA: Diagnosis not present

## 2024-04-17 DIAGNOSIS — Z860100 Personal history of colon polyps, unspecified: Secondary | ICD-10-CM | POA: Diagnosis not present

## 2024-04-17 DIAGNOSIS — R1310 Dysphagia, unspecified: Secondary | ICD-10-CM | POA: Diagnosis not present

## 2024-05-07 DIAGNOSIS — E538 Deficiency of other specified B group vitamins: Secondary | ICD-10-CM | POA: Diagnosis not present

## 2024-05-08 DIAGNOSIS — R051 Acute cough: Secondary | ICD-10-CM | POA: Diagnosis not present

## 2024-06-02 DIAGNOSIS — F424 Excoriation (skin-picking) disorder: Secondary | ICD-10-CM | POA: Diagnosis not present

## 2024-06-02 DIAGNOSIS — D2239 Melanocytic nevi of other parts of face: Secondary | ICD-10-CM | POA: Diagnosis not present

## 2024-06-02 DIAGNOSIS — D2271 Melanocytic nevi of right lower limb, including hip: Secondary | ICD-10-CM | POA: Diagnosis not present

## 2024-06-02 DIAGNOSIS — Z85828 Personal history of other malignant neoplasm of skin: Secondary | ICD-10-CM | POA: Diagnosis not present

## 2024-06-02 DIAGNOSIS — E538 Deficiency of other specified B group vitamins: Secondary | ICD-10-CM | POA: Diagnosis not present

## 2024-06-02 DIAGNOSIS — L814 Other melanin hyperpigmentation: Secondary | ICD-10-CM | POA: Diagnosis not present

## 2024-06-02 DIAGNOSIS — D225 Melanocytic nevi of trunk: Secondary | ICD-10-CM | POA: Diagnosis not present

## 2024-06-02 DIAGNOSIS — L821 Other seborrheic keratosis: Secondary | ICD-10-CM | POA: Diagnosis not present
